# Patient Record
Sex: Female | Born: 1975 | Race: White | Hispanic: No | Marital: Married | Smoking: Never smoker
Health system: Southern US, Community
[De-identification: ages and names within clinical notes are randomized; demographics above are authoritative.]

## PROBLEM LIST (undated history)

## (undated) ENCOUNTER — Inpatient Hospital Stay (HOSPITAL_COMMUNITY): Payer: Self-pay

## (undated) DIAGNOSIS — B999 Unspecified infectious disease: Secondary | ICD-10-CM

## (undated) DIAGNOSIS — J40 Bronchitis, not specified as acute or chronic: Secondary | ICD-10-CM

## (undated) DIAGNOSIS — R87619 Unspecified abnormal cytological findings in specimens from cervix uteri: Secondary | ICD-10-CM

## (undated) DIAGNOSIS — Z9889 Other specified postprocedural states: Secondary | ICD-10-CM

## (undated) DIAGNOSIS — N1370 Vesicoureteral-reflux, unspecified: Secondary | ICD-10-CM

## (undated) DIAGNOSIS — IMO0002 Reserved for concepts with insufficient information to code with codable children: Secondary | ICD-10-CM

## (undated) DIAGNOSIS — D649 Anemia, unspecified: Secondary | ICD-10-CM

## (undated) DIAGNOSIS — Z5189 Encounter for other specified aftercare: Secondary | ICD-10-CM

## (undated) DIAGNOSIS — E041 Nontoxic single thyroid nodule: Secondary | ICD-10-CM

## (undated) DIAGNOSIS — R112 Nausea with vomiting, unspecified: Secondary | ICD-10-CM

## (undated) DIAGNOSIS — S42302A Unspecified fracture of shaft of humerus, left arm, initial encounter for closed fracture: Secondary | ICD-10-CM

## (undated) HISTORY — DX: Bronchitis, not specified as acute or chronic: J40

## (undated) HISTORY — DX: Unspecified abnormal cytological findings in specimens from cervix uteri: R87.619

## (undated) HISTORY — DX: Reserved for concepts with insufficient information to code with codable children: IMO0002

## (undated) HISTORY — DX: Anemia, unspecified: D64.9

## (undated) HISTORY — PX: APPENDECTOMY: SHX54

## (undated) HISTORY — DX: Nontoxic single thyroid nodule: E04.1

## (undated) HISTORY — DX: Unspecified infectious disease: B99.9

## (undated) HISTORY — DX: Vesicoureteral-reflux, unspecified: N13.70

## (undated) HISTORY — DX: Unspecified fracture of shaft of humerus, left arm, initial encounter for closed fracture: S42.302A

## (undated) HISTORY — PX: WISDOM TOOTH EXTRACTION: SHX21

## (undated) HISTORY — PX: BREAST ENHANCEMENT SURGERY: SHX7

## (undated) HISTORY — DX: Encounter for other specified aftercare: Z51.89

---

## 1998-06-21 ENCOUNTER — Emergency Department (HOSPITAL_COMMUNITY): Admission: EM | Admit: 1998-06-21 | Discharge: 1998-06-21 | Payer: Self-pay | Admitting: Emergency Medicine

## 1998-06-21 ENCOUNTER — Encounter: Payer: Self-pay | Admitting: Emergency Medicine

## 2005-06-20 ENCOUNTER — Other Ambulatory Visit: Admission: RE | Admit: 2005-06-20 | Discharge: 2005-06-20 | Payer: Self-pay | Admitting: *Deleted

## 2005-07-12 ENCOUNTER — Other Ambulatory Visit: Admission: RE | Admit: 2005-07-12 | Discharge: 2005-07-12 | Payer: Self-pay | Admitting: Endocrinology

## 2005-08-23 ENCOUNTER — Other Ambulatory Visit: Admission: RE | Admit: 2005-08-23 | Discharge: 2005-08-23 | Payer: Self-pay | Admitting: *Deleted

## 2005-09-04 DIAGNOSIS — E041 Nontoxic single thyroid nodule: Secondary | ICD-10-CM

## 2005-09-04 HISTORY — DX: Nontoxic single thyroid nodule: E04.1

## 2005-09-04 HISTORY — PX: THYROID SURGERY: SHX805

## 2006-03-01 ENCOUNTER — Other Ambulatory Visit: Admission: RE | Admit: 2006-03-01 | Discharge: 2006-03-01 | Payer: Self-pay | Admitting: *Deleted

## 2007-03-09 ENCOUNTER — Inpatient Hospital Stay (HOSPITAL_COMMUNITY): Admission: AD | Admit: 2007-03-09 | Discharge: 2007-03-12 | Payer: Self-pay | Admitting: Obstetrics and Gynecology

## 2010-04-03 ENCOUNTER — Inpatient Hospital Stay (HOSPITAL_COMMUNITY): Admission: AD | Admit: 2010-04-03 | Discharge: 2010-04-03 | Payer: Self-pay | Admitting: Obstetrics and Gynecology

## 2010-04-26 ENCOUNTER — Ambulatory Visit (HOSPITAL_COMMUNITY): Admission: RE | Admit: 2010-04-26 | Discharge: 2010-04-26 | Payer: Self-pay | Admitting: Obstetrics and Gynecology

## 2010-09-12 ENCOUNTER — Inpatient Hospital Stay (HOSPITAL_COMMUNITY)
Admission: AD | Admit: 2010-09-12 | Discharge: 2010-09-14 | Disposition: A | Discharge status: Still In Hospital | Payer: Self-pay | Source: Home / Self Care | Attending: Obstetrics and Gynecology | Admitting: Obstetrics and Gynecology

## 2010-09-16 ENCOUNTER — Inpatient Hospital Stay (HOSPITAL_COMMUNITY): Admission: AD | Admit: 2010-09-16 | Payer: Self-pay | Source: Home / Self Care | Admitting: Obstetrics and Gynecology

## 2010-09-17 ENCOUNTER — Observation Stay (HOSPITAL_COMMUNITY)
Admission: AD | Admit: 2010-09-17 | Discharge: 2010-09-17 | Payer: Self-pay | Source: Home / Self Care | Attending: Obstetrics and Gynecology | Admitting: Obstetrics and Gynecology

## 2010-09-19 LAB — CBC
HCT: 29.2 % — ABNORMAL LOW (ref 36.0–46.0)
HCT: 19.4 % — ABNORMAL LOW (ref 36.0–46.0)
HCT: 23.1 % — ABNORMAL LOW (ref 36.0–46.0)
HCT: 27.7 % — ABNORMAL LOW (ref 36.0–46.0)
Hemoglobin: 9.2 g/dL — ABNORMAL LOW (ref 12.0–15.0)
Hemoglobin: 6.1 g/dL — CL (ref 12.0–15.0)
Hemoglobin: 7.1 g/dL — ABNORMAL LOW (ref 12.0–15.0)
Hemoglobin: 8.9 g/dL — ABNORMAL LOW (ref 12.0–15.0)
MCH: 22.9 pg — ABNORMAL LOW (ref 26.0–34.0)
MCH: 23.5 pg — ABNORMAL LOW (ref 26.0–34.0)
MCH: 23.1 pg — ABNORMAL LOW (ref 26.0–34.0)
MCH: 24.7 pg — ABNORMAL LOW (ref 26.0–34.0)
MCHC: 31.5 g/dL (ref 30.0–36.0)
MCHC: 31.4 g/dL (ref 30.0–36.0)
MCHC: 30.7 g/dL (ref 30.0–36.0)
MCHC: 32.1 g/dL (ref 30.0–36.0)
MCV: 72.6 fL — ABNORMAL LOW (ref 78.0–100.0)
MCV: 74.6 fL — ABNORMAL LOW (ref 78.0–100.0)
MCV: 75.2 fL — ABNORMAL LOW (ref 78.0–100.0)
MCV: 76.7 fL — ABNORMAL LOW (ref 78.0–100.0)
Platelets: 264 10*3/uL (ref 150–400)
Platelets: 211 10*3/uL (ref 150–400)
Platelets: 270 10*3/uL (ref 150–400)
Platelets: 293 10*3/uL (ref 150–400)
RBC: 4.02 MIL/uL (ref 3.87–5.11)
RBC: 2.6 MIL/uL — ABNORMAL LOW (ref 3.87–5.11)
RBC: 3.07 MIL/uL — ABNORMAL LOW (ref 3.87–5.11)
RBC: 3.61 MIL/uL — ABNORMAL LOW (ref 3.87–5.11)
RDW: 14.6 % (ref 11.5–15.5)
RDW: 15 % (ref 11.5–15.5)
RDW: 15.6 % — ABNORMAL HIGH (ref 11.5–15.5)
RDW: 16.3 % — ABNORMAL HIGH (ref 11.5–15.5)
WBC: 14.2 10*3/uL — ABNORMAL HIGH (ref 4.0–10.5)
WBC: 15.5 10*3/uL — ABNORMAL HIGH (ref 4.0–10.5)
WBC: 12.9 10*3/uL — ABNORMAL HIGH (ref 4.0–10.5)
WBC: 13.3 10*3/uL — ABNORMAL HIGH (ref 4.0–10.5)

## 2010-09-19 LAB — RPR: RPR Ser Ql: NONREACTIVE

## 2010-09-19 LAB — ABO/RH: ABO/RH(D): AB POS

## 2010-09-21 LAB — CROSSMATCH
ABO/RH(D): AB POS
Antibody Screen: NEGATIVE
Unit division: 0
Unit division: 0

## 2010-11-19 LAB — URINALYSIS, ROUTINE W REFLEX MICROSCOPIC
Bilirubin Urine: NEGATIVE
Glucose, UA: NEGATIVE mg/dL
Hgb urine dipstick: NEGATIVE
Ketones, ur: NEGATIVE mg/dL
Nitrite: NEGATIVE
Protein, ur: NEGATIVE mg/dL
Specific Gravity, Urine: <1.005 — ABNORMAL LOW (ref 1.005–1.030)
Urobilinogen, UA: 0.2 mg/dL (ref 0.0–1.0)
pH: 6 (ref 5.0–8.0)

## 2010-11-19 LAB — WET PREP, GENITAL
Clue Cells Wet Prep HPF POC: NONE SEEN
Trich, Wet Prep: NONE SEEN
Yeast Wet Prep HPF POC: NONE SEEN

## 2011-01-17 NOTE — H&P (Signed)
NAMELOWEN, Diane NO.:  [id number]   MEDICAL RECORD NO.:  [id number]          PATIENT TYPE:  INP   LOCATION:  9165                          FACILITY:  [facility]   PHYSICIAN:  Osborn Coho, M.D.   DATE OF BIRTH:  06/15/1976   DATE OF ADMISSION:  03/09/2007  DATE OF DISCHARGE:                              HISTORY & PHYSICAL   Diane [name] is a 35 year old, married white female, primigravida at 21-  3/7 weeks who presents with leaking clear fluid this morning. She has  been having irregular contractions, no bleeding, positive fetal  movement, no signs or symptoms of PIH. Her pregnancy has been followed  by the [facility] Cert[facility] Midwife [facility] and has  been remarkable for:  1) Thyroid disease with partial thyroidectomy, 2)  group B strep negative.   Prenatal labs were collected on August 13, 2006. Hemoglobin 13.0,  hematocrit 37.3, platelets 236,000, blood type AB positive, antibody  negative, RPR nonreactive, rubella immune, hepatitis B surface antigen  negative, TSH was 1.409. Pap smear from March 01, 2006 was within normal  limits and Pap smear from August 13, 2006 was within normal limits.  One-hour Glucola from December 06, 2006 was 74, TSH at that time was 2.157.  Culture of the vaginal tract for group B strep in June 2008 was  negative.   HISTORY OF PRESENT PREGNANCY:  The patient presented for care at [facility] on August 13, 2006 at 10-6/[redacted] weeks gestation. Pregnancy  ultrasonography October 09, 2006 shows growth consistent with previous  dating confirming [facility] of March 06, 2007. TSH levels drawn each trimester  were within normal limits. The patient has remained normotensive with  size equal to dates throughout her pregnancy. She brought in a birth  plan in third trimester that was reviewed and she planned on a doula  during labor.   OBSTETRIC HISTORY:  She is primigravida.   MEDICAL HISTORY:  She has no medication allergies. She  experienced  menarche at the age 46 with 30-35 day cycles lasting 7 days. She had an  abnormal Pap smear in November 2006 with normal since then. She has used  diaphragm and IUD in the past for contraception. She had a history of  nodules on her thyroid with half of her thyroid removed not requiring  medications now. She had a urinary tract infection as a child.   SURGICAL HISTORY:  Remarkable for partial thyroidectomy. Also remarkable  for wisdom teeth extraction and appendectomy.   FAMILY HISTORY:  Maternal grandfather with MI, mother with varicosities.  Mother with pulmonary fibrosis. Father with type 2 diabetes. Paternal  grandfather with lung cancer. Paternal grandmother with breast cancer.   GENETIC HISTORY:  Negative.   SOCIAL HISTORY:  The patient is married to the father of the baby, his  name is Alycia Rossetti, he is involved and supportive. They are of the [place]  faith. They both have 16 years of education. The patient is employed  fulltime as a Quarry manager. The father of the baby is a Pharmacist, hospital. They deny any alcohol, tobacco or illicit  drug use with the  pregnancy.   OBJECTIVE:  VITAL SIGNS:  Blood pressure is 120/80, other vital signs  are stable. She is afebrile.  HEENT:  Grossly within normal limits.  CHEST:  Clear to auscultation.  HEART:  Regular rate and rhythm.  ABDOMEN:  Gravid in contour with fundal height extending approximately  40 cm above the pubic symphysic. Fetal heart rate is reactive and  reassuring. Contractions are every 3-6 minutes and mild. A sterile  speculum exam shows positive pulling of clear fluid, positive Nitrazine,  positive ferning. The cervix is 2+ cm dilated, 70% effaced, vertex -2.  EXTREMITIES:  Normal.   ASSESSMENT:  1. Intrauterine pregnancy at term.  2. Spontaneous rupture of membranes.   PLAN:  1. Admit to birthing suite.  2. Routine CNM orders.  3. The patient plans expectant management for now. Birth plan is  on      our chart and has been reviewed.      Cam Hai, C.N.M.      Osborn Coho, M.D.  Electronically Signed    KS/MEDQ  D:  03/09/2007  T:  03/09/2007  Job:  [id number]

## 2011-06-20 LAB — RPR: RPR Ser Ql: NONREACTIVE

## 2011-06-20 LAB — CBC
HCT: 22.4 — ABNORMAL LOW
HCT: 31.8 — ABNORMAL LOW
Hemoglobin: 10.4 — ABNORMAL LOW
Hemoglobin: 7.6 — CL
MCHC: 32.7
MCHC: 33.8
MCV: 80.9
MCV: 81.4
Platelets: 222
Platelets: 284
RBC: 2.75 — ABNORMAL LOW
RBC: 3.93
RDW: 13.9
RDW: 14
WBC: 14.7 — ABNORMAL HIGH
WBC: 16.1 — ABNORMAL HIGH

## 2011-06-20 LAB — RAPID HIV SCREEN (WH-MAU): Rapid HIV Screen: NONREACTIVE

## 2012-06-23 ENCOUNTER — Telehealth (HOSPITAL_COMMUNITY): Payer: Self-pay | Admitting: Obstetrics and Gynecology

## 2012-06-23 NOTE — Telephone Encounter (Signed)
Pt approx [redacted]wks pregnant.  States she ran a 5K race this AM and now has noted a small amt of red spotting with sm amt in underpants and with wiping.  No cramping or pain.  Did have intercourse last PM as well.  Has appt in office on Tuesday, 06/26/12.  States blood type is AB pos.  Does have hx of SAB x 1 between her other 2 pregs.  Rec rest, fluids, pelvic rest, and observe.  SAB precautions d/w pt.  F/U at office as previously scheduled.

## 2012-06-25 ENCOUNTER — Ambulatory Visit (INDEPENDENT_AMBULATORY_CARE_PROVIDER_SITE_OTHER): Payer: BC Managed Care – PPO | Admitting: Obstetrics and Gynecology

## 2012-06-25 DIAGNOSIS — O26849 Uterine size-date discrepancy, unspecified trimester: Secondary | ICD-10-CM

## 2012-06-25 DIAGNOSIS — Z331 Pregnant state, incidental: Secondary | ICD-10-CM

## 2012-06-25 LAB — POCT URINALYSIS DIPSTICK
Bilirubin, UA: NEGATIVE
Blood, UA: NEGATIVE
Glucose, UA: NEGATIVE
Ketones, UA: NEGATIVE
Leukocytes, UA: NEGATIVE
Nitrite, UA: NEGATIVE
Protein, UA: NEGATIVE
Spec Grav, UA: 1.01
Urobilinogen, UA: NEGATIVE
pH, UA: 8

## 2012-06-25 LAB — TSH: TSH: 1.528 u[IU]/mL (ref 0.350–4.500)

## 2012-06-25 NOTE — Progress Notes (Signed)
NOB interview.  Pt unsure of LMP. Is breast feeding. Also had spotting 06/22/12 after running a 5 K and IC the previous evening Spoke with Conni Elliot and was told to observe. Per VL, QHCG done and U/S for dating scheduled. NOB W/U to be scheduled depending on results.

## 2012-06-26 ENCOUNTER — Other Ambulatory Visit: Payer: Self-pay | Admitting: Obstetrics and Gynecology

## 2012-06-26 ENCOUNTER — Telehealth: Payer: Self-pay | Admitting: Obstetrics and Gynecology

## 2012-06-26 DIAGNOSIS — O200 Threatened abortion: Secondary | ICD-10-CM

## 2012-06-26 LAB — PRENATAL PANEL VII
Antibody Screen: NEGATIVE
Basophils Absolute: 0 10*3/uL (ref 0.0–0.1)
Basophils Relative: 0 % (ref 0–1)
Eosinophils Absolute: 0.1 10*3/uL (ref 0.0–0.7)
Eosinophils Relative: 1 % (ref 0–5)
HCT: 40.7 % (ref 36.0–46.0)
HIV: NONREACTIVE
Hemoglobin: 14.6 g/dL (ref 12.0–15.0)
Hepatitis B Surface Ag: NEGATIVE
Lymphocytes Relative: 23 % (ref 12–46)
Lymphs Abs: 2.2 10*3/uL (ref 0.7–4.0)
MCH: 31.6 pg (ref 26.0–34.0)
MCHC: 35.9 g/dL (ref 30.0–36.0)
MCV: 88.1 fL (ref 78.0–100.0)
Monocytes Absolute: 0.8 10*3/uL (ref 0.1–1.0)
Monocytes Relative: 8 % (ref 3–12)
Neutro Abs: 6.4 10*3/uL (ref 1.7–7.7)
Neutrophils Relative %: 68 % (ref 43–77)
Platelets: 274 10*3/uL (ref 150–400)
RBC: 4.62 MIL/uL (ref 3.87–5.11)
RDW: 13.4 % (ref 11.5–15.5)
Rh Type: POSITIVE
Rubella: 315.9 IU/mL — ABNORMAL HIGH
WBC: 9.4 10*3/uL (ref 4.0–10.5)

## 2012-06-26 LAB — HCG, QUANTITATIVE, PREGNANCY: hCG, Beta Chain, Quant, S: 4374 m[IU]/mL

## 2012-06-26 NOTE — Telephone Encounter (Signed)
Liya called for Quant result: 4350 F/U USS Monday 07/01/12 as per VL

## 2012-06-27 ENCOUNTER — Encounter (HOSPITAL_COMMUNITY): Payer: Self-pay | Admitting: *Deleted

## 2012-06-27 ENCOUNTER — Inpatient Hospital Stay (HOSPITAL_COMMUNITY)
Admission: AD | Admit: 2012-06-27 | Discharge: 2012-06-27 | Disposition: A | Discharge status: Home / Self Care | Payer: BC Managed Care – PPO | Source: Ambulatory Visit | Attending: Obstetrics and Gynecology | Admitting: Obstetrics and Gynecology

## 2012-06-27 ENCOUNTER — Other Ambulatory Visit: Payer: Self-pay | Admitting: Obstetrics and Gynecology

## 2012-06-27 ENCOUNTER — Telehealth: Payer: Self-pay | Admitting: Obstetrics and Gynecology

## 2012-06-27 ENCOUNTER — Inpatient Hospital Stay (HOSPITAL_COMMUNITY): Payer: BC Managed Care – PPO

## 2012-06-27 ENCOUNTER — Encounter: Payer: Self-pay | Admitting: Obstetrics and Gynecology

## 2012-06-27 DIAGNOSIS — O30019 Twin pregnancy, monochorionic/monoamniotic, unspecified trimester: Secondary | ICD-10-CM

## 2012-06-27 DIAGNOSIS — O30009 Twin pregnancy, unspecified number of placenta and unspecified number of amniotic sacs, unspecified trimester: Secondary | ICD-10-CM | POA: Insufficient documentation

## 2012-06-27 DIAGNOSIS — O209 Hemorrhage in early pregnancy, unspecified: Secondary | ICD-10-CM | POA: Insufficient documentation

## 2012-06-27 DIAGNOSIS — Z3689 Encounter for other specified antenatal screening: Secondary | ICD-10-CM | POA: Insufficient documentation

## 2012-06-27 DIAGNOSIS — O200 Threatened abortion: Secondary | ICD-10-CM

## 2012-06-27 LAB — HCG, QUANTITATIVE, PREGNANCY: hCG, Beta Chain, Quant, S: 3997 m[IU]/mL

## 2012-06-27 LAB — CULTURE, OB URINE
Colony Count: NO GROWTH
Organism ID, Bacteria: NO GROWTH

## 2012-06-27 NOTE — Telephone Encounter (Signed)
S: Patient had called last night with Hx of Quant being draw earlier in the week on Monday 06/24/12. O: Had repeat Quant  completed today and the quant has declined from initial value 4374 > 3997 A: Possible SAB P: Called patient and offered for her to USS to confirm further findings. Patient will return this PM for USS as suggested.  Earl Gala, CNM.

## 2012-06-27 NOTE — MAU Note (Signed)
Pt had blood work drawn for [employer] and is going to have an ultrasound today

## 2012-06-27 NOTE — MAU Provider Note (Signed)
History   Diane [name] has been concerned re Quant levels from this week. Had repeat Quant level this morning. Had called patient this morning with the result of the quant. The patient had been advised of  Quant Level decreasing. Offered to return for USS this pm to confirm findings. Patient to have USS for  ongpoing pregnancy confirmation today. Patient is very nervous and upset at this time has had Hx of AB.  CSN: [id number]  Arrival date and time: 06/27/12 1326   None     Chief Complaint  Patient presents with  . Possible Pregnancy   HPI  OB History    Grav Para Term Preterm Abortions TAB SAB Ect Mult Living   4 2 2  1  1   2      Obstetric Comments   2012 3 DAY PP HEMORRHAGE; LOW HG - BLOOD TRANSFUSION      Past Medical History  Diagnosis Date  . Thyroid cyst 2007     NO MEDS  . Blood transfusion without reported diagnosis     PP 2012  . Abnormal Pap smear     REPEAT NORMAL; LAST PAP 12/2010  . Infection     UTI OCC  . Urinary reflux CHILDHOOD  . Anemia     DURING PREG  . Bronchitis     WINTER  . Arm fracture, left AGE 73    Past Surgical History  Procedure Date  . Appendectomy AGE 58  . Thyroid surgery 2007  . Wisdom tooth extraction AGE 53    Family History  Problem Relation Age of Onset  . Rheum arthritis Mother   . Other Mother     PULMONARY FIBROSIS; VARICOSE VEINS  . Cancer Mother     MELANOMA  . Hypertension Mother   . Arthritis Father   . Hearing loss Father   . Hyperlipidemia Father   . Hypertension Father   . Other Sister     VARCOSE VEINS  . Kidney disease Sister     URINARY REFLUX  . Kidney disease Daughter     URINARY REFLUX  . Heart disease Maternal Uncle   . Miscarriages / Stillbirths Maternal Grandmother   . Heart disease Maternal Grandfather   . Cancer Paternal Grandmother     BREAST  . Hearing loss Paternal Grandmother   . Cancer Paternal Grandfather     LUNG  . Thyroid disease Sister   . Kidney disease Sister    URINARY REFLUX  . Kidney disease Other     URINARY REFLUX    History  Substance Use Topics  . Smoking status: Never Smoker   . Smokeless tobacco: Never Used  . Alcohol Use: 3.5 oz/week    7 drink(s) per week     WINE; D/C'D WITH +UPT    Allergies: No Known Allergies  Prescriptions prior to admission  Medication Sig Dispense Refill  . Prenatal Vit-Fe Fumarate-FA (PRENATAL PO) Take 1 tablet by mouth. OTC        ROS Physical Exam   Last menstrual period 05/10/2012, unknown if currently breastfeeding.  Physical Exam  MAU Course  Procedures F/U on declining Quant with USS.  Assessment and Plan  Result of USS: Living Monochorionic Twin IUP with [place] Gest. Age of [redacted]w[redacted]d and EDD of 02/21/13. Amnionicity not confidently determined and Mono-amniotic twins cannot be excluded. Small subchorionic hemorrhage noted. Small SCH seen and advised not to exercise for 2 - 3 weeks  F/U USS in 4 weeks recommended to f/u  amnionicity. F/U Quant on Monday 07/01/12 at [facility].  Trinitee Horgan, CNM. 06/27/2012, 1:29 PM

## 2012-06-28 ENCOUNTER — Other Ambulatory Visit: Payer: Self-pay | Admitting: Obstetrics and Gynecology

## 2012-06-28 ENCOUNTER — Telehealth: Payer: Self-pay

## 2012-06-28 ENCOUNTER — Other Ambulatory Visit: Payer: Self-pay

## 2012-06-28 DIAGNOSIS — IMO0001 Reserved for inherently not codable concepts without codable children: Secondary | ICD-10-CM

## 2012-06-28 DIAGNOSIS — O30009 Twin pregnancy, unspecified number of placenta and unspecified number of amniotic sacs, unspecified trimester: Secondary | ICD-10-CM

## 2012-06-28 NOTE — Telephone Encounter (Signed)
Message copied by Rolla Plate on Fri Jun 28, 2012  9:59 AM ------      Message from: [name]      Created: Fri Jun 28, 2012  1:17 AM       Pt had a [place] that suggested she has mono/ mono twins.  Refer pt to MFM for Korea, counseling if needed and dx.

## 2012-06-28 NOTE — Telephone Encounter (Signed)
Spoke with pt rgd referral informed appt with [facility] 07/02/12 at 12:30 pt voice understanding

## 2012-06-29 ENCOUNTER — Telehealth: Payer: Self-pay | Admitting: Obstetrics and Gynecology

## 2012-06-29 NOTE — Telephone Encounter (Signed)
Patient called. Pregnancy discussed. The patient had a viable twin gestation on ultrasound. Her hCG values, however, were decreasing slightly. She denies bleeding and cramping. She has an appointment with maternal fetal medicine in 3 days. For the moment, she has no insurance coverage and she pays all of her medical bills out of pocket.  Management options reviewed. Repeat hCG, repeat ultrasound, MFM consultation, and observation outlined. The risk and benefits were discussed. For now, the patient will cancel the MFM consultation. She will discuss our review with her husband. She will call us in 1-2 weeks to let us know how she is doing and how she feels she wants to proceed.  Genetic testing discussed. The patient was told to call if she has any bleeding or cramping.  Dr. Stefano Gaul

## 2012-07-01 ENCOUNTER — Encounter: Payer: BC Managed Care – PPO | Admitting: Obstetrics and Gynecology

## 2012-07-01 ENCOUNTER — Other Ambulatory Visit: Payer: BC Managed Care – PPO

## 2012-07-02 ENCOUNTER — Ambulatory Visit (HOSPITAL_COMMUNITY): Payer: BC Managed Care – PPO

## 2012-07-08 ENCOUNTER — Telehealth: Payer: Self-pay | Admitting: Obstetrics and Gynecology

## 2012-07-08 NOTE — Telephone Encounter (Signed)
Pt returning your call

## 2012-07-11 ENCOUNTER — Telehealth: Payer: Self-pay | Admitting: Obstetrics and Gynecology

## 2012-07-12 ENCOUNTER — Telehealth: Payer: Self-pay

## 2012-07-12 ENCOUNTER — Other Ambulatory Visit: Payer: Self-pay

## 2012-07-12 DIAGNOSIS — IMO0001 Reserved for inherently not codable concepts without codable children: Secondary | ICD-10-CM

## 2012-07-12 NOTE — Telephone Encounter (Signed)
Pt was scheduled for U/S on 07-15-2012 @ 2:30pm Pt made aware and pt agreeable.  [facility] CMA

## 2012-07-15 ENCOUNTER — Encounter: Payer: Self-pay | Admitting: Obstetrics and Gynecology

## 2012-07-15 ENCOUNTER — Ambulatory Visit (INDEPENDENT_AMBULATORY_CARE_PROVIDER_SITE_OTHER): Payer: BC Managed Care – PPO | Admitting: Obstetrics and Gynecology

## 2012-07-15 ENCOUNTER — Ambulatory Visit (INDEPENDENT_AMBULATORY_CARE_PROVIDER_SITE_OTHER): Payer: BC Managed Care – PPO

## 2012-07-15 ENCOUNTER — Encounter: Payer: BC Managed Care – PPO | Admitting: Obstetrics and Gynecology

## 2012-07-15 VITALS — BP 114/66 | Ht 64.0 in | Wt 135.0 lb

## 2012-07-15 DIAGNOSIS — O30009 Twin pregnancy, unspecified number of placenta and unspecified number of amniotic sacs, unspecified trimester: Secondary | ICD-10-CM

## 2012-07-15 DIAGNOSIS — IMO0001 Reserved for inherently not codable concepts without codable children: Secondary | ICD-10-CM

## 2012-07-15 DIAGNOSIS — O021 Missed abortion: Secondary | ICD-10-CM

## 2012-07-15 NOTE — Progress Notes (Signed)
HISTORY OF PRESENT ILLNESS  [name] is a 36 y.o. year old female,G4P2012, who presents for a problem visit. The patient has a known twin gestation.  A previous ultrasound show fetal heart rates.  Her hCG values were decreasing however.  The patient has had a miscarriage in the past.  She was treated with Cytotec but had a very unpleasant experience.  It took 5 months for her hCG values to return to normal. Blood type is AB+.  Subjective:  The patient is very sad about her nonviable twins on ultrasound.  Objective:  BP 114/66  Ht 5' 4" (1.626 m)  Wt 135 lb (61.236 kg)  BMI 23.17 kg/m2  LMP 05/10/2012  Breastfeeding? No   General: mild distress  Exam deferred.  Ultrasound: Twin gestation with both twins being nonviable.  Gestational age for twin A is 7 weeks and 6 days.  Gestational age for twin B is 6 weeks and 2 days.  The ovaries appear normal.  Assessment:  Missed abortion of twin gestation in the first trimester.  Prior miscarriage.  Plan:  Management options reviewed.  Risk and benefits discussed.  The patient would discuss future care with her husband.  I will call later tonight.  Return to office prn if symptoms worsen or fail to improve.  Telephone call to patient: the patient wants a dilatation and evacuation on Tuesday, July 16, 2012.  She is scheduled at 4:30 PM.  She will remain n.p.o. after 8:00 AM.  Dr. Roberts will be her surgeon.   Reanne Nellums Vernon Handsome Anglin M.D.  07/15/2012 6:56 PM   

## 2012-07-16 ENCOUNTER — Encounter (HOSPITAL_COMMUNITY): Payer: Self-pay | Admitting: Anesthesiology

## 2012-07-16 ENCOUNTER — Ambulatory Visit (HOSPITAL_COMMUNITY)
Admission: RE | Admit: 2012-07-16 | Discharge: 2012-07-16 | Disposition: A | Discharge status: Home / Self Care | Payer: BC Managed Care – PPO | Source: Ambulatory Visit | Attending: Obstetrics and Gynecology | Admitting: Obstetrics and Gynecology

## 2012-07-16 ENCOUNTER — Encounter: Payer: BC Managed Care – PPO | Admitting: Obstetrics and Gynecology

## 2012-07-16 ENCOUNTER — Ambulatory Visit (HOSPITAL_COMMUNITY): Payer: BC Managed Care – PPO | Admitting: Anesthesiology

## 2012-07-16 ENCOUNTER — Encounter (HOSPITAL_COMMUNITY): Payer: Self-pay | Admitting: *Deleted

## 2012-07-16 ENCOUNTER — Encounter (HOSPITAL_COMMUNITY)
Admission: RE | Disposition: A | Discharge status: Home / Self Care | Payer: Self-pay | Source: Ambulatory Visit | Attending: Obstetrics and Gynecology

## 2012-07-16 DIAGNOSIS — O021 Missed abortion: Secondary | ICD-10-CM | POA: Insufficient documentation

## 2012-07-16 DIAGNOSIS — O034 Incomplete spontaneous abortion without complication: Secondary | ICD-10-CM

## 2012-07-16 HISTORY — PX: DILATION AND EVACUATION: SHX1459

## 2012-07-16 HISTORY — DX: Other specified postprocedural states: Z98.890

## 2012-07-16 HISTORY — DX: Nausea with vomiting, unspecified: R11.2

## 2012-07-16 LAB — CBC
MCH: 31.2 pg (ref 26.0–34.0)
MCV: 88.1 fL (ref 78.0–100.0)
Platelets: 281 10*3/uL (ref 150–400)
RDW: 12.9 % (ref 11.5–15.5)

## 2012-07-16 SURGERY — DILATION AND EVACUATION, UTERUS
Anesthesia: Monitor Anesthesia Care | Wound class: Clean Contaminated

## 2012-07-16 MED ORDER — ONDANSETRON HCL 4 MG/2ML IJ SOLN: 4.0000 mg | Freq: Once | INTRAMUSCULAR | Status: DC | PRN

## 2012-07-16 MED ORDER — LACTATED RINGERS IV SOLN
INTRAVENOUS | Status: DC
  Administered 2012-07-16: 16:00:00 via INTRAVENOUS

## 2012-07-16 MED ORDER — ONDANSETRON HCL 4 MG/2ML IJ SOLN
INTRAMUSCULAR | Status: DC | PRN
  Administered 2012-07-16: 4 mg via INTRAVENOUS

## 2012-07-16 MED ORDER — PROPOFOL 10 MG/ML IV EMUL
INTRAVENOUS | Status: AC
  Filled 2012-07-16: qty 20

## 2012-07-16 MED ORDER — HYDROCODONE-ACETAMINOPHEN 5-500 MG PO TABS: 1.0000 | ORAL_TABLET | Freq: Four times a day (QID) | ORAL | Status: DC | PRN

## 2012-07-16 MED ORDER — MEPERIDINE HCL 25 MG/ML IJ SOLN: 6.2500 mg | INTRAMUSCULAR | Status: DC | PRN

## 2012-07-16 MED ORDER — KETOROLAC TROMETHAMINE 30 MG/ML IJ SOLN
INTRAMUSCULAR | Status: AC
  Filled 2012-07-16: qty 1

## 2012-07-16 MED ORDER — LIDOCAINE HCL 2 % IJ SOLN
INTRAMUSCULAR | Status: DC | PRN
  Administered 2012-07-16: 10 mL

## 2012-07-16 MED ORDER — MIDAZOLAM HCL 2 MG/2ML IJ SOLN
INTRAMUSCULAR | Status: AC
  Filled 2012-07-16: qty 2

## 2012-07-16 MED ORDER — ONDANSETRON HCL 4 MG/2ML IJ SOLN
INTRAMUSCULAR | Status: AC
  Filled 2012-07-16: qty 2

## 2012-07-16 MED ORDER — SCOPOLAMINE 1 MG/3DAYS TD PT72
MEDICATED_PATCH | TRANSDERMAL | Status: AC
  Administered 2012-07-16: 1.5 mg via TRANSDERMAL
  Filled 2012-07-16: qty 1

## 2012-07-16 MED ORDER — LIDOCAINE HCL 2 % IJ SOLN
INTRAMUSCULAR | Status: AC
  Filled 2012-07-16: qty 20

## 2012-07-16 MED ORDER — FENTANYL CITRATE 0.05 MG/ML IJ SOLN: 25.0000 ug | INTRAMUSCULAR | Status: DC | PRN

## 2012-07-16 MED ORDER — KETOROLAC TROMETHAMINE 30 MG/ML IJ SOLN: 15.0000 mg | Freq: Once | INTRAMUSCULAR | Status: DC | PRN

## 2012-07-16 MED ORDER — KETOROLAC TROMETHAMINE 30 MG/ML IJ SOLN
INTRAMUSCULAR | Status: DC | PRN
  Administered 2012-07-16: 30 mg via INTRAVENOUS

## 2012-07-16 MED ORDER — IBUPROFEN 600 MG PO TABS: 600.0000 mg | ORAL_TABLET | Freq: Four times a day (QID) | ORAL | Status: DC | PRN

## 2012-07-16 MED ORDER — FENTANYL CITRATE 0.05 MG/ML IJ SOLN
INTRAMUSCULAR | Status: AC
  Filled 2012-07-16: qty 2

## 2012-07-16 MED ORDER — PROPOFOL 10 MG/ML IV EMUL
INTRAVENOUS | Status: DC | PRN
  Administered 2012-07-16 (×2): 30 mg via INTRAVENOUS
  Administered 2012-07-16: 50 mg via INTRAVENOUS
  Administered 2012-07-16: 30 mg via INTRAVENOUS

## 2012-07-16 MED ORDER — LIDOCAINE HCL (CARDIAC) 20 MG/ML IV SOLN
INTRAVENOUS | Status: AC
  Filled 2012-07-16: qty 5

## 2012-07-16 MED ORDER — LIDOCAINE HCL (CARDIAC) 20 MG/ML IV SOLN
INTRAVENOUS | Status: DC | PRN
  Administered 2012-07-16: 50 mg via INTRAVENOUS

## 2012-07-16 MED ORDER — SCOPOLAMINE 1 MG/3DAYS TD PT72
1.0000 | MEDICATED_PATCH | Freq: Once | TRANSDERMAL | Status: DC
  Administered 2012-07-16: 1.5 mg via TRANSDERMAL

## 2012-07-16 SURGICAL SUPPLY — 21 items
CATH ROBINSON RED A/P 16FR (CATHETERS) ×2 IMPLANT
CLOTH BEACON ORANGE TIMEOUT ST (SAFETY) ×2 IMPLANT
DECANTER SPIKE VIAL GLASS SM (MISCELLANEOUS) ×2 IMPLANT
GLOVE BIO SURGEON STRL SZ7.5 (GLOVE) ×4 IMPLANT
GLOVE BIOGEL PI IND STRL 7.5 (GLOVE) ×1 IMPLANT
GLOVE BIOGEL PI INDICATOR 7.5 (GLOVE) ×1
GOWN STRL REIN XL XLG (GOWN DISPOSABLE) ×2 IMPLANT
KIT BERKELEY 1ST TRIMESTER 3/8 (MISCELLANEOUS) ×2 IMPLANT
NEEDLE SPNL 22GX3.5 QUINCKE BK (NEEDLE) ×2 IMPLANT
NS IRRIG 1000ML POUR BTL (IV SOLUTION) ×2 IMPLANT
PACK VAGINAL MINOR WOMEN LF (CUSTOM PROCEDURE TRAY) ×2 IMPLANT
PAD OB MATERNITY 4.3X12.25 (PERSONAL CARE ITEMS) ×2 IMPLANT
PAD PREP 24X48 CUFFED NSTRL (MISCELLANEOUS) ×2 IMPLANT
SET BERKELEY SUCTION TUBING (SUCTIONS) ×2 IMPLANT
SYR CONTROL 10ML LL (SYRINGE) ×2 IMPLANT
TOWEL OR 17X24 6PK STRL BLUE (TOWEL DISPOSABLE) ×4 IMPLANT
VACURETTE 10 RIGID CVD (CANNULA) IMPLANT
VACURETTE 7MM CVD STRL WRAP (CANNULA) IMPLANT
VACURETTE 8 RIGID CVD (CANNULA) IMPLANT
VACURETTE 9 RIGID CVD (CANNULA) IMPLANT
WATER STERILE IRR 1000ML POUR (IV SOLUTION) ×2 IMPLANT

## 2012-07-16 NOTE — Op Note (Signed)
Preop Diagnosis: Missed Abortion,  7-Weeks Twins   Postop Diagnosis: Missed Abortion,  7-Weeks Twins   Procedure: DILATATION AND EVACUATION   Anesthesia: Moderate Sedation   Anesthesiologist: Sandrea Hughs., MD   Attending: Purcell Nails, MD   Assistant: N/a  Findings: POCs and Uterus sounded to 9cm  Pathology: POCs  Fluids: 400cc  UOP: QS via straight cath prior to procedure  EBL: Minimal  Complications: None  Procedure: The patient was taken to the operating room after the risks benefits and alternatives were discussed with the patient, the patient verbalized understanding and consent signed and witnessed.  The patient was placed under MAC anesthesia, prepped and draped in the normal sterile fashion and a time out was performed.  A bivalve speculum was placed in the patient's vagina and the anterior lip of the cervix grasped with a single-tooth tenaculum. A paracervical block was administered using a total of 10 cc of 2% lidocaine.  The uterus was sounded to 9 cm and a size 8 suction curette was used. Suction curettage was performed until minimal tissue returned. Sharp curettage was performed until a gritty texture was noted. Suction curettage was performed once again to remove any remaining debris. All instruments were removed. The count was correct. The patient was transferred to the recovery room in good condition.

## 2012-07-16 NOTE — H&P (View-Only) (Signed)
HISTORY OF PRESENT ILLNESS  [name] is a 36 y.o. year old female,G4P2012, who presents for a problem visit. The patient has a known twin gestation.  A previous ultrasound show fetal heart rates.  Her hCG values were decreasing however.  The patient has had a miscarriage in the past.  She was treated with Cytotec but had a very unpleasant experience.  It took 5 months for her hCG values to return to normal. Blood type is AB+.  Subjective:  The patient is very sad about her nonviable twins on ultrasound.  Objective:  BP 114/66  Ht 5\' 4"  (1.626 m)  Wt 135 lb (61.236 kg)  BMI 23.17 kg/m2  LMP 05/10/2012  Breastfeeding? No   General: mild distress  Exam deferred.  Ultrasound: Twin gestation with both twins being nonviable.  Gestational age for twin A is 7 weeks and 6 days.  Gestational age for twin B is 6 weeks and 2 days.  The ovaries appear normal.  Assessment:  Missed abortion of twin gestation in the first trimester.  Prior miscarriage.  Plan:  Management options reviewed.  Risk and benefits discussed.  The patient would discuss future care with her husband.  I will call later tonight.  Return to office prn if symptoms worsen or fail to improve.  Telephone call to patient: the patient wants a dilatation and evacuation on Tuesday, July 16, 2012.  She is scheduled at 4:30 PM.  She will remain n.p.o. after 8:00 AM.  Dr. Su Hilt will be her surgeon.   Leonard Schwartz M.D.  07/15/2012 6:56 PM

## 2012-07-16 NOTE — Progress Notes (Signed)
Post discharge checklist done at 1830, not 1953.  Late entry.

## 2012-07-16 NOTE — Interval H&P Note (Signed)
History and Physical Interval Note:  07/16/2012 3:37 PM  Diane [name]  has presented today for surgery, with the diagnosis of Missed Abortion,  7 weeks twins  The various methods of treatment have been discussed with the patient and family. After consideration of risks, benefits and other options for treatment, the patient has consented to  Procedure(s) (LRB) with comments: DILATATION AND EVACUATION (N/A) as a surgical intervention .  The patient's history has been reviewed, patient examined, no change in status, stable for surgery.  I have reviewed the patient's chart and labs.  Questions were answered to the patient's satisfaction.     Purcell Nails

## 2012-07-16 NOTE — Transfer of Care (Signed)
Immediate Anesthesia Transfer of Care Note  Patient: Diane [name]  Procedure(s) Performed: Procedure(s) (LRB) with comments: DILATATION AND EVACUATION (N/A)  Patient Location: PACU  Anesthesia Type:MAC  Level of Consciousness: awake, alert , oriented and patient cooperative  Airway & Oxygen Therapy: Patient Spontanous Breathing  Post-op Assessment: Report given to PACU RN, Post -op Vital signs reviewed and stable and Patient moving all extremities X 4  Post vital signs: Reviewed and stable  Complications: No apparent anesthesia complications

## 2012-07-16 NOTE — Anesthesia Preprocedure Evaluation (Signed)
Anesthesia Evaluation  Patient identified by MRN, date of birth, ID band Patient awake    Reviewed: Allergy & Precautions, H&P , NPO status , Patient's Chart, lab work & pertinent test results  History of Anesthesia Complications (+) PONV  Airway Mallampati: I TM Distance: >3 FB Neck ROM: full    Dental No notable dental hx. (+) Teeth Intact   Pulmonary neg pulmonary ROS,    Pulmonary exam normal       Cardiovascular negative cardio ROS      Neuro/Psych negative neurological ROS  negative psych ROS   GI/Hepatic negative GI ROS, Neg liver ROS,   Endo/Other  negative endocrine ROS  Renal/GU negative Renal ROS  negative genitourinary   Musculoskeletal negative musculoskeletal ROS (+)   Abdominal Normal abdominal exam  (+)   Peds negative pediatric ROS (+)  Hematology negative hematology ROS (+)   Anesthesia Other Findings   Reproductive/Obstetrics negative OB ROS                           Anesthesia Physical Anesthesia Plan  ASA: II  Anesthesia Plan: MAC   Post-op Pain Management:    Induction: Intravenous  Airway Management Planned:   Additional Equipment:   Intra-op Plan:   Post-operative Plan:   Informed Consent: I have reviewed the patients History and Physical, chart, labs and discussed the procedure including the risks, benefits and alternatives for the proposed anesthesia with the patient or authorized representative who has indicated his/her understanding and acceptance.     Plan Discussed with: CRNA and Surgeon  Anesthesia Plan Comments:         Anesthesia Quick Evaluation

## 2012-07-17 ENCOUNTER — Encounter (HOSPITAL_COMMUNITY): Payer: Self-pay | Admitting: Obstetrics and Gynecology

## 2012-07-17 NOTE — Telephone Encounter (Signed)
Patient called.  Management reviewed.  We will discuss at her office visit.  Dr. Stefano Gaul

## 2012-07-17 NOTE — Anesthesia Postprocedure Evaluation (Signed)
Anesthesia Post Note  Patient: Diane [name]  Procedure(s) Performed: Procedure(s) (LRB): DILATATION AND EVACUATION (N/A)  Anesthesia type: MAC  Patient location: PACU  Post pain: Pain level controlled  Post assessment: Post-op Vital signs reviewed  Last Vitals:  Filed Vitals:   07/16/12 1800  BP: 103/51  Pulse: 55  Temp: 36.9 C  Resp: 19    Post vital signs: Reviewed  Level of consciousness: sedated  Complications: No apparent anesthesia complications

## 2012-08-06 ENCOUNTER — Ambulatory Visit (INDEPENDENT_AMBULATORY_CARE_PROVIDER_SITE_OTHER): Payer: BC Managed Care – PPO | Admitting: Obstetrics and Gynecology

## 2012-08-06 ENCOUNTER — Encounter: Payer: Self-pay | Admitting: Obstetrics and Gynecology

## 2012-08-06 VITALS — BP 98/60 | Temp 98.6°F | Ht 64.0 in | Wt 140.0 lb

## 2012-08-06 DIAGNOSIS — O039 Complete or unspecified spontaneous abortion without complication: Secondary | ICD-10-CM

## 2012-08-06 NOTE — Progress Notes (Signed)
DATE OF SURGERY: 07/16/12 TYPE OF SURGERY:DF&E PAIN:No VAG BLEEDING: no VAG DISCHARGE: no NORMAL GI FUNCTN: yes NORMAL GU FUNCTN: yes Pt without c/o BP 98/60  Temp 98.6 F (37 C)  Ht 5\' 4"  (1.626 m)  Wt 140 lb (63.504 kg)  BMI 24.03 kg/m2  LMP 05/10/2012  Physical Examination: General appearance - alert, well appearing, and in no distress Abdomen - soft, nontender, nondistended, no masses or organomegaly Physical Examination: Pelvic - normal external genitalia, vulva, vagina, cervix, uterus and adnexa    FINAL for Diane [name], Diane [name] ([id number]) Patient Name: Diane [name], Diane [name] Accession #: [id number] DOB: 1976-06-25 Age: 36 Gender: F Client Name [facility] Collected Date: 07/17/2012 Received Date: 07/17/2012 Physician: Osborn Coho Chart #: MRN # : [id number] Physician cc: Race:W Visit #: [id number] REPORT OF SURGICAL PATHOLOGY FINAL DIAGNOSIS Diagnosis Products of Conception - CHORIONIC VILLI IDENTIFIED. Pecola Leisure MD Pathologist, Electronic Signature (Case signed 07/19/2012) Specimen Gross and Clinical Information Specimen(s) Obtained: Products of Conception Specimen Clinical Information missed abortion, 7-weeks, twins [jl] Gross Received in formalin within a tissue trap is a 3.8 x 3.8 x 2.0 cm aggregate of soft, pink tan tissue which includes rare papillary fragments suggestive of chorionic villi. Fetal structures are not identified. A portion is submitted in one block. (TB:eps 07/17/12) Report signed out from the following location(s) [facility] [address] [place], [place], [place] [address]. CLIA #: [id number], [facility] [address], [place], [place] [address]. CLIA #: [id number], S/p D&E for missed abortion Pt will wait 3 months to try again Take PNV Check bhcg quant after last miscarriage pt did not have a menses for 4 months and her betas decreased slowly Pt with good emotional support

## 2012-08-07 LAB — HCG, QUANTITATIVE, PREGNANCY: hCG, Beta Chain, Quant, S: 5.7 m[IU]/mL

## 2012-08-10 ENCOUNTER — Encounter: Payer: Self-pay | Admitting: Obstetrics and Gynecology

## 2012-08-14 ENCOUNTER — Telehealth: Payer: Self-pay | Admitting: Obstetrics and Gynecology

## 2012-08-14 ENCOUNTER — Other Ambulatory Visit: Payer: Self-pay | Admitting: Obstetrics and Gynecology

## 2012-08-14 DIAGNOSIS — O039 Complete or unspecified spontaneous abortion without complication: Secondary | ICD-10-CM

## 2012-08-14 NOTE — Telephone Encounter (Signed)
TC to pt. Per DR ND scheduled repeat [employer] 08/15/12.

## 2012-08-14 NOTE — Telephone Encounter (Signed)
Message copied by Mason Jim on Wed Aug 14, 2012 11:37 AM ------      Message from: [name]      Created: Wed Aug 14, 2012  3:06 AM       One more bhcg in one week      ----- Message -----         From: Constance Haw, RN         Sent: 08/13/2012   9:46 AM           To: Michael Litter, MD            Pt's last QHCG on 08/06/12 was 5.7.  Does she need further testing or F/U?

## 2012-08-15 ENCOUNTER — Other Ambulatory Visit: Payer: BC Managed Care – PPO

## 2012-08-15 DIAGNOSIS — O30019 Twin pregnancy, monochorionic/monoamniotic, unspecified trimester: Secondary | ICD-10-CM

## 2012-08-15 DIAGNOSIS — O200 Threatened abortion: Secondary | ICD-10-CM

## 2012-08-16 ENCOUNTER — Telehealth: Payer: Self-pay | Admitting: Obstetrics and Gynecology

## 2012-08-16 LAB — HCG, QUANTITATIVE, PREGNANCY: hCG, Beta Chain, Quant, S: <2 m[IU]/mL

## 2012-08-16 NOTE — Telephone Encounter (Signed)
Spoke with pt rgd msg. Pt stated she wanted her quant results. Advised pt that her labs was <2.0. pts voice understanding. bt cma

## 2012-10-19 ENCOUNTER — Other Ambulatory Visit: Payer: Self-pay

## 2013-04-30 ENCOUNTER — Encounter (HOSPITAL_COMMUNITY): Payer: Self-pay | Admitting: *Deleted

## 2013-06-13 LAB — OB RESULTS CONSOLE HIV ANTIBODY (ROUTINE TESTING): HIV: NONREACTIVE

## 2013-06-13 LAB — OB RESULTS CONSOLE HEPATITIS B SURFACE ANTIGEN: Hepatitis B Surface Ag: NEGATIVE

## 2013-06-13 LAB — OB RESULTS CONSOLE RPR: RPR: NONREACTIVE

## 2013-06-13 LAB — OB RESULTS CONSOLE GBS: GBS: POSITIVE

## 2013-06-13 LAB — OB RESULTS CONSOLE RUBELLA ANTIBODY, IGM: RUBELLA: IMMUNE

## 2013-06-13 LAB — OB RESULTS CONSOLE ANTIBODY SCREEN: Antibody Screen: NEGATIVE

## 2013-06-13 LAB — OB RESULTS CONSOLE ABO/RH: RH Type: POSITIVE

## 2013-06-23 LAB — OB RESULTS CONSOLE GC/CHLAMYDIA
Chlamydia: NEGATIVE
GC PROBE AMP, GENITAL: NEGATIVE

## 2013-07-10 ENCOUNTER — Other Ambulatory Visit: Payer: Self-pay

## 2013-09-04 NOTE — L&D Delivery Note (Signed)
Delivery Note  First Stage: Labor onset: 1900 - active labor at 2200 Augmentation : none Analgesia /Anesthesia intrapartum: none AROM at 0302  Second Stage: Complete dilation at 0346 Onset of pushing at 0346 FHR 135 with moderate variability - intermittent EFM  Delivery of a viable female at [id number] by CNM in LOA position no nuchal cord - loose cord around foot x 1 noted cord double clamped after cessation of pulsation, cut by FOB  Baby passed to Dad for skin to skin for bonding to assist Mom out of tub Decreased color and tone to newborn ~ 9 minutes of age - HR low less than 100 Stimulation as transferred to warmer for assessment and resuscitation - Code Apgar called at 10 minutes O2 initiated NICU team arrival at 11 minutes of age and assumed care of newborn  Apgar 7-8 then 2 at 10 minutes   Apgar at 15 minutes - 9 per NICU team  Patient assisted out of tub to bed ~ 0405  Arterial cord blood sample collected and sent to lab  (7.27) Cord blood sample collected   Third Stage: [facility] with 3 VC @ 0408 Placenta disposition: to send home with patient Uterine tone firm / bleeding moderate initially then small  1st degree laceration identified with small extension into vagina midline on vaginal floor Anesthesia for repair: 1% xylocaine local Repair 3-0 vicryl vaginal repair and 4-0 subcuticular perineal Est. Blood Loss (mL): 400  Complications: newborn apneic event requiring resuscitation and NICU admit for observation  Mom to postpartum.  Baby to NICU.  Newborn: Birth Weight: 9-2  Apgar Scores: 7-8 then 2 then 9 Feeding planned: breast  Diane [name] CNM, MSN, [facility] 01/09/2014, 5:19 AM

## 2013-11-03 ENCOUNTER — Encounter (HOSPITAL_COMMUNITY): Payer: Self-pay | Admitting: Emergency Medicine

## 2013-11-03 ENCOUNTER — Emergency Department (HOSPITAL_COMMUNITY)
Admission: EM | Admit: 2013-11-03 | Discharge: 2013-11-03 | Disposition: A | Discharge status: Home / Self Care | Payer: BC Managed Care – PPO | Attending: Emergency Medicine | Admitting: Emergency Medicine

## 2013-11-03 ENCOUNTER — Emergency Department (HOSPITAL_COMMUNITY): Payer: BC Managed Care – PPO

## 2013-11-03 DIAGNOSIS — R05 Cough: Secondary | ICD-10-CM | POA: Insufficient documentation

## 2013-11-03 DIAGNOSIS — R0602 Shortness of breath: Secondary | ICD-10-CM | POA: Insufficient documentation

## 2013-11-03 DIAGNOSIS — O9989 Other specified diseases and conditions complicating pregnancy, childbirth and the puerperium: Secondary | ICD-10-CM | POA: Insufficient documentation

## 2013-11-03 DIAGNOSIS — R059 Cough, unspecified: Secondary | ICD-10-CM | POA: Insufficient documentation

## 2013-11-03 DIAGNOSIS — Z8639 Personal history of other endocrine, nutritional and metabolic disease: Secondary | ICD-10-CM | POA: Insufficient documentation

## 2013-11-03 DIAGNOSIS — Z8781 Personal history of (healed) traumatic fracture: Secondary | ICD-10-CM | POA: Insufficient documentation

## 2013-11-03 DIAGNOSIS — R0789 Other chest pain: Secondary | ICD-10-CM | POA: Insufficient documentation

## 2013-11-03 DIAGNOSIS — Z3490 Encounter for supervision of normal pregnancy, unspecified, unspecified trimester: Secondary | ICD-10-CM

## 2013-11-03 DIAGNOSIS — Z862 Personal history of diseases of the blood and blood-forming organs and certain disorders involving the immune mechanism: Secondary | ICD-10-CM | POA: Insufficient documentation

## 2013-11-03 DIAGNOSIS — Z87440 Personal history of urinary (tract) infections: Secondary | ICD-10-CM | POA: Insufficient documentation

## 2013-11-03 DIAGNOSIS — O99019 Anemia complicating pregnancy, unspecified trimester: Secondary | ICD-10-CM | POA: Insufficient documentation

## 2013-11-03 DIAGNOSIS — Z79899 Other long term (current) drug therapy: Secondary | ICD-10-CM | POA: Insufficient documentation

## 2013-11-03 LAB — CBC WITH DIFFERENTIAL/PLATELET
BASOS ABS: 0 10*3/uL (ref 0.0–0.1)
BASOS PCT: 0 % (ref 0–1)
Eosinophils Absolute: 0.2 10*3/uL (ref 0.0–0.7)
Eosinophils Relative: 2 % (ref 0–5)
HCT: 32.7 % — ABNORMAL LOW (ref 36.0–46.0)
Hemoglobin: 11.6 g/dL — ABNORMAL LOW (ref 12.0–15.0)
Lymphocytes Relative: 17 % (ref 12–46)
Lymphs Abs: 1.9 10*3/uL (ref 0.7–4.0)
MCH: 31.4 pg (ref 26.0–34.0)
MCHC: 35.5 g/dL (ref 30.0–36.0)
MCV: 88.4 fL (ref 78.0–100.0)
Monocytes Absolute: 1.1 10*3/uL — ABNORMAL HIGH (ref 0.1–1.0)
Monocytes Relative: 10 % (ref 3–12)
NEUTROS PCT: 71 % (ref 43–77)
Neutro Abs: 7.9 10*3/uL — ABNORMAL HIGH (ref 1.7–7.7)
PLATELETS: 179 10*3/uL (ref 150–400)
RBC: 3.7 MIL/uL — ABNORMAL LOW (ref 3.87–5.11)
RDW: 12.5 % (ref 11.5–15.5)
WBC: 11.1 10*3/uL — ABNORMAL HIGH (ref 4.0–10.5)

## 2013-11-03 LAB — COMPREHENSIVE METABOLIC PANEL
ALBUMIN: 2.6 g/dL — AB (ref 3.5–5.2)
ALK PHOS: 103 U/L (ref 39–117)
ALT: 19 U/L (ref 0–35)
AST: 17 U/L (ref 0–37)
BILIRUBIN TOTAL: 0.2 mg/dL — AB (ref 0.3–1.2)
BUN: 6 mg/dL (ref 6–23)
CO2: 22 mEq/L (ref 19–32)
Calcium: 8.7 mg/dL (ref 8.4–10.5)
Chloride: 102 mEq/L (ref 96–112)
Creatinine, Ser: 0.56 mg/dL (ref 0.50–1.10)
GFR calc Af Amer: >90 mL/min (ref >90)
GFR calc non Af Amer: >90 mL/min (ref >90)
Glucose, Bld: 90 mg/dL (ref 70–99)
POTASSIUM: 3.8 meq/L (ref 3.7–5.3)
Sodium: 137 mEq/L (ref 137–147)
Total Protein: 6.6 g/dL (ref 6.0–8.3)

## 2013-11-03 MED ORDER — ALBUTEROL SULFATE (2.5 MG/3ML) 0.083% IN NEBU
2.5000 mg | INHALATION_SOLUTION | Freq: Once | RESPIRATORY_TRACT | Status: AC
  Administered 2013-11-03: 2.5 mg via RESPIRATORY_TRACT
  Filled 2013-11-03: qty 3

## 2013-11-03 MED ORDER — BENZONATATE 100 MG PO CAPS: 100.0000 mg | ORAL_CAPSULE | Freq: Three times a day (TID) | ORAL | Status: DC | PRN

## 2013-11-03 MED ORDER — BENZONATATE 100 MG PO CAPS
100.0000 mg | ORAL_CAPSULE | Freq: Once | ORAL | Status: AC
  Administered 2013-11-03: 100 mg via ORAL
  Filled 2013-11-03: qty 1

## 2013-11-03 MED ORDER — HYDROCODONE-HOMATROPINE 5-1.5 MG/5ML PO SYRP: 5.0000 mL | ORAL_SOLUTION | Freq: Four times a day (QID) | ORAL | Status: DC | PRN

## 2013-11-03 NOTE — ED Notes (Addendum)
Dr. Ranae PalmsYelverton at bedside. OB Nurse at bedside with monitor.

## 2013-11-03 NOTE — Progress Notes (Signed)
RROB called to monitor pt who is 29 wks with chest pain, coughing, and sob.

## 2013-11-03 NOTE — Progress Notes (Signed)
This note also relates to the following rows which could not be included: Pulse Rate - Cannot attach notes to unvalidated device data SpO2 - Cannot attach notes to unvalidated device data   Ed physician spoke with on call provider Rolitta Dawson,CNM to discuss plan of care.  Efm being discontinued, fhr reactive and reassurring.

## 2013-11-03 NOTE — ED Notes (Signed)
Pt reports being [redacted] weeks pregnant and just got back today from trip from [place] and complains of cough and sob.

## 2013-11-03 NOTE — ED Notes (Signed)
OB nurse at bedside 

## 2013-11-03 NOTE — ED Notes (Signed)
Respiratory paged to reassess pt from Albuterol treatment.
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
## 2013-11-03 NOTE — ED Provider Notes (Signed)
CSN: [id number]     Arrival date & time 11/03/13  0316 History   First MD Initiated Contact with Patient 11/03/13 [phone]     Chief Complaint  Patient presents with  . Shortness of Breath     (Consider location/radiation/quality/duration/timing/severity/associated sxs/prior Treatment) HPI Patient is 5329 week pregnant female presents with persistent cough as her main complaint. She states that she has been unable to sleep due to the coughing. It is nonproductive and hacking. She's had roughly one week of upper respiratory symptoms including nasal congestion and sore throat. This has progressed to cough and mild shortness of breath the last few days. The patient does have mild central chest pain especially with cough. She has no lower extremity swelling or pain. She [place] but her symptoms started prior to boarding the plane. She's been contact with her midwife who prescribed Mucinex. Patient states she's had good fetal movements. She's had no loss of fluids. She's had no vaginal bleeding. She denies any abdominal or pelvic pain. Past Medical History  Diagnosis Date  . Thyroid cyst 2007     NO MEDS  . Blood transfusion without reported diagnosis     PP 2012  . Abnormal Pap smear     REPEAT NORMAL; LAST PAP 12/2010  . Infection     UTI OCC  . Urinary reflux CHILDHOOD  . Anemia     DURING PREG  . Bronchitis     WINTER  . Arm fracture, left AGE 59  . PONV (postoperative nausea and vomiting)    Past Surgical History  Procedure Laterality Date  . Appendectomy  AGE 73  . Thyroid surgery  2007  . Wisdom tooth extraction  AGE 89  . Dilation and evacuation  07/16/2012    Procedure: DILATATION AND EVACUATION;  Surgeon: Purcell NailsAngela Y Roberts, MD;  Location: [facility];  Service: Gynecology;  Laterality: N/A;   Family History  Problem Relation Age of Onset  . Rheum arthritis Mother   . Other Mother     PULMONARY FIBROSIS; VARICOSE VEINS  . Cancer Mother     MELANOMA  .  Hypertension Mother   . Arthritis Father   . Hearing loss Father   . Hyperlipidemia Father   . Hypertension Father   . Other Sister     VARCOSE VEINS  . Kidney disease Sister     URINARY REFLUX  . Kidney disease Daughter     URINARY REFLUX  . Heart disease Maternal Uncle   . Miscarriages / Stillbirths Maternal Grandmother   . Heart disease Maternal Grandfather   . Cancer Paternal Grandmother     BREAST  . Hearing loss Paternal Grandmother   . Cancer Paternal Grandfather     LUNG  . Thyroid disease Sister   . Kidney disease Sister     URINARY REFLUX  . Kidney disease Other     URINARY REFLUX   History  Substance Use Topics  . Smoking status: Never Smoker   . Smokeless tobacco: Never Used  . Alcohol Use: 3.5 oz/week    7 drink(s) per week     Comment: WINE; D/C'D WITH +UPT   OB History   Grav Para Term Preterm Abortions TAB SAB Ect Mult Living   4 2 2  1  1   2      Obstetric Comments   2012 3 DAY PP HEMORRHAGE; LOW HG - BLOOD TRANSFUSION     Review of Systems  Constitutional: Negative for fever  and chills.  HENT: Positive for congestion. Negative for rhinorrhea, sinus pressure and sore throat.   Respiratory: Positive for cough and shortness of breath. Negative for chest tightness and wheezing.   Cardiovascular: Positive for chest pain. Negative for palpitations and leg swelling.  Gastrointestinal: Negative for nausea, vomiting, abdominal pain, diarrhea and constipation.  Genitourinary: Negative for vaginal bleeding, vaginal discharge, vaginal pain and pelvic pain.  Musculoskeletal: Negative for back pain, neck pain and neck stiffness.  Skin: Negative for rash and wound.  Neurological: Negative for dizziness, weakness, light-headedness, numbness and headaches.  All other systems reviewed and are negative.      Allergies  Review of patient's allergies indicates no known allergies.  Home Medications   Current Outpatient Rx  Name  Route  Sig  Dispense  Refill   . acetaminophen (TYLENOL) 500 MG tablet   Oral   Take 1,000 mg by mouth every 6 (six) hours as needed for moderate pain.         Marland Kitchen CALCIUM PO   Oral   Take 1 tablet by mouth daily.         . diphenhydrAMINE (BENADRYL) 25 MG tablet   Oral   Take 50 mg by mouth at bedtime as needed for sleep.         Marland Kitchen guaiFENesin (MUCINEX) 600 MG 12 hr tablet   Oral   Take 1,200 mg by mouth 2 (two) times daily as needed for cough.         . Prenatal Vit-Fe Fumarate-FA (PRENATAL MULTIVITAMIN) TABS   Oral   Take 1 tablet by mouth daily.         . ranitidine (ZANTAC) 150 MG tablet   Oral   Take 150 mg by mouth daily.          BP 128/60  Pulse 98  Temp(Src) 97.4 F (36.3 C) (Oral)  Resp 18  Ht 5\' 4"  (1.626 m)  Wt 174 lb (78.926 kg)  BMI 29.85 kg/m2  SpO2 100% Physical Exam  Nursing note and vitals reviewed. Constitutional: She is oriented to person, place, and time. She appears well-developed and well-nourished. No distress.  HENT:  Head: Normocephalic and atraumatic.  Mouth/Throat: Oropharynx is clear and moist.  Eyes: EOM are normal. Pupils are equal, round, and reactive to light.  Neck: Normal range of motion. Neck supple.  Cardiovascular: Normal rate and regular rhythm.   Pulmonary/Chest: Effort normal and breath sounds normal. No respiratory distress. She has no wheezes. She has no rales. She exhibits tenderness (central chest tenderness with palpation of the anterior chest. No crepitance or deformity.).  Abdominal: Soft. Bowel sounds are normal. She exhibits no distension and no mass. There is no tenderness. There is no rebound and no guarding.  Gravid abdomen without tenderness.  Musculoskeletal: Normal range of motion. She exhibits no edema and no tenderness.  No calf tenderness or pain.  Neurological: She is alert and oriented to person, place, and time.  Patient is alert and oriented x3 with clear, goal oriented speech. Patient has 5/5 motor in all extremities.  Sensation is intact to light touch.   Skin: Skin is warm and dry. No rash noted. No erythema.  Psychiatric: She has a normal mood and affect. Her behavior is normal.    ED Course  Procedures (including critical care time) Labs Review Labs Reviewed  CBC WITH DIFFERENTIAL - Abnormal; Notable for the following:    WBC 11.1 (*)    RBC 3.70 (*)    Hemoglobin  11.6 (*)    HCT 32.7 (*)    Neutro Abs 7.9 (*)    Monocytes Absolute 1.1 (*)    All other components within normal limits  COMPREHENSIVE METABOLIC PANEL - Abnormal; Notable for the following:    Albumin 2.6 (*)    Total Bilirubin 0.2 (*)    All other components within normal limits   Imaging Review Dg Chest 2 View  11/03/2013   CLINICAL DATA:  Cough for 5 days, assess for pneumonia.  EXAM: CHEST  2 VIEW  COMPARISON:  None available for comparison at time of study interpretation.  FINDINGS: Cardiac silhouette is mildly enlarged, mediastinal silhouette is nonsuspicious. No pleural effusions or focal consolidations. No pneumothorax.  Abdominal shield. Soft tissue planes and included osseous structures are nonsuspicious.  IMPRESSION: Mild cardiomegaly, no acute pulmonary process.   Electronically Signed   By: Awilda Metro   On: 11/03/2013 04:10     EKG Interpretation   Date/Time:  Monday November 03 2013 03:25:19 EST Ventricular Rate:  90 PR Interval:  120 QRS Duration: 90 QT Interval:  357 QTC Calculation: 437 R Axis:   19 Text Interpretation:  Sinus rhythm Low voltage, precordial leads  Borderline repolarization abnormality Baseline wander in lead(s) III aVF  Confirmed by Ranae Palms  MD, Karle Desrosier ([id number]) on 11/03/2013 5:57:10 AM      MDM   Final diagnoses:  None   I have very low suspicion for PE. Her symptoms are consistent with infectious etiology. They have been present before she was on her flight. Her predominant symptom is cough. Her chest pain is reproducible and is musculoskeletal in origin. She has no lower extremity  swelling or pain. Her heart rate is below 100. She's satting 100% on room air. I discussed with the patient my low suspicion. She agrees with the plan not to work her up further for pulmonary embolism. Chest x-ray was obtained to rule out pneumonia. Patient understood the risk involved with a low levels of radiation. I talked with the patient's midwife. She's been in communication with the patient regarding her respiratory illness. She advises nebulized treatment and possibly Tessalon versus Hycodan for cough control. She urges the patient to followup with her.  Patient with no acute improvement in her cough. She is resting comfortably. She is in no distress. Vital signs remained stable in the emergency department. He was monitored without any events. Patient be discharged home with cough medication and advised to followup with her OB/GYN. She is agreeable with plan. Return precautions have been given.  Loren Racer, MD 11/03/13 [phone]

## 2014-01-08 ENCOUNTER — Encounter (HOSPITAL_COMMUNITY): Payer: Self-pay

## 2014-01-08 ENCOUNTER — Inpatient Hospital Stay (HOSPITAL_COMMUNITY)
Admission: AD | Admit: 2014-01-08 | Discharge: 2014-01-11 | DRG: 775 | Disposition: A | Discharge status: Home / Self Care | Payer: BC Managed Care – PPO | Source: Ambulatory Visit | Attending: Obstetrics & Gynecology | Admitting: Obstetrics & Gynecology

## 2014-01-08 DIAGNOSIS — O99892 Other specified diseases and conditions complicating childbirth: Secondary | ICD-10-CM | POA: Diagnosis present

## 2014-01-08 DIAGNOSIS — Z808 Family history of malignant neoplasm of other organs or systems: Secondary | ICD-10-CM

## 2014-01-08 DIAGNOSIS — O9989 Other specified diseases and conditions complicating pregnancy, childbirth and the puerperium: Secondary | ICD-10-CM

## 2014-01-08 DIAGNOSIS — O09529 Supervision of elderly multigravida, unspecified trimester: Secondary | ICD-10-CM | POA: Diagnosis present

## 2014-01-08 DIAGNOSIS — Z22330 Carrier of Group B streptococcus: Secondary | ICD-10-CM

## 2014-01-08 DIAGNOSIS — Z8249 Family history of ischemic heart disease and other diseases of the circulatory system: Secondary | ICD-10-CM

## 2014-01-08 NOTE — MAU Note (Signed)
contractions 

## 2014-01-08 NOTE — MAU Provider Note (Signed)
History     CSN: [id number]  Arrival date and time: 01/08/14 2304 Provider here to see patient @ 2340     Chief Complaint  Patient presents with  . Labor Eval   HPI Ctx since 1900 tonight stronger and closer in past 2 hours Some bloody show No LOF  Past Medical History  Diagnosis Date  . Thyroid cyst 2007     NO MEDS  . Blood transfusion without reported diagnosis     PP 2012  . Abnormal Pap smear     REPEAT NORMAL; LAST PAP 12/2010  . Infection     UTI OCC  . Urinary reflux CHILDHOOD  . Anemia     DURING PREG  . Bronchitis     WINTER  . Arm fracture, left AGE 73  . PONV (postoperative nausea and vomiting)     Past Surgical History  Procedure Laterality Date  . Appendectomy  AGE 49  . Thyroid surgery  2007  . Wisdom tooth extraction  AGE 36  . Dilation and evacuation  07/16/2012    Procedure: DILATATION AND EVACUATION;  Surgeon: Purcell NailsAngela Y Roberts, MD;  Location: [facility];  Service: Gynecology;  Laterality: N/A;    Family History  Problem Relation Age of Onset  . Rheum arthritis Mother   . Other Mother     PULMONARY FIBROSIS; VARICOSE VEINS  . Cancer Mother     MELANOMA  . Hypertension Mother   . Arthritis Father   . Hearing loss Father   . Hyperlipidemia Father   . Hypertension Father   . Other Sister     VARCOSE VEINS  . Kidney disease Sister     URINARY REFLUX  . Kidney disease Daughter     URINARY REFLUX  . Heart disease Maternal Uncle   . Miscarriages / Stillbirths Maternal Grandmother   . Heart disease Maternal Grandfather   . Cancer Paternal Grandmother     BREAST  . Hearing loss Paternal Grandmother   . Cancer Paternal Grandfather     LUNG  . Thyroid disease Sister   . Kidney disease Sister     URINARY REFLUX  . Kidney disease Other     URINARY REFLUX    History  Substance Use Topics  . Smoking status: Never Smoker   . Smokeless tobacco: Never Used  . Alcohol Use: 3.5 oz/week    7 drink(s) per week     Comment: WINE; D/C'D WITH  +UPT    Allergies: No Known Allergies  Prescriptions prior to admission  Medication Sig Dispense Refill  . CALCIUM PO Take 1 tablet by mouth daily.      . Prenatal Vit-Fe Fumarate-FA (PRENATAL MULTIVITAMIN) TABS Take 1 tablet by mouth daily.      . ranitidine (ZANTAC) 150 MG tablet Take 150 mg by mouth daily.      Marland Kitchen. acetaminophen (TYLENOL) 500 MG tablet Take 1,000 mg by mouth every 6 (six) hours as needed for moderate pain.      . benzonatate (TESSALON) 100 MG capsule Take 1 capsule (100 mg total) by mouth 3 (three) times daily as needed for cough.  21 capsule  0  . diphenhydrAMINE (BENADRYL) 25 MG tablet Take 50 mg by mouth at bedtime as needed for sleep.      Marland Kitchen. guaiFENesin (MUCINEX) 600 MG 12 hr tablet Take 1,200 mg by mouth 2 (two) times daily as needed for cough.      Marland Kitchen. HYDROcodone-homatropine (HYCODAN) 5-1.5 MG/5ML syrup Take 5 mLs by  mouth every 6 (six) hours as needed for cough.  120 mL  0    ROS Physical Exam   Blood pressure 132/72, pulse 89, resp. rate 18, height 5\' 4"  (1.626 m), weight 85.276 kg (188 lb), SpO2 99.00%, unknown if currently breastfeeding.  Physical Exam  Calm and comfortable - breathing with ctx Abdomen soft and non-tender VE: 4 / 90% / vtx / -1 / BBOW MAU Course  Procedures  NST - reactive Assessment and Plan  39 weeks onset of labor Latent labor - desires water birth  Diane [name] Grave 01/08/2014, 11:41 PM

## 2014-01-08 NOTE — H&P (Signed)
OB ADMISSION/ HISTORY & PHYSICAL:  Admission Date: 01/08/2014 11:04 PM  Admit Diagnosis: 39 weeks / latent labor  Diane [name] is a 38 y.o. female presenting for onset of labor at 571900.  Prenatal History: [id number]   EDC : 01/15/2014, by Other Basis  [facility]  Primary Ob Provider: [name] CNM Prenatal course complicated by AMA / + GBS  Prenatal Labs: ABO, Rh:  AB positive Antibody:  negative Rubella:   Immune RPR:   NR HBsAg:   negative HIV:   NR GTT: NL GBS:   Positive (bacturia)  Medical / Surgical History :  Past medical history:  Past Medical History  Diagnosis Date  . Thyroid cyst 2007     NO MEDS  . Blood transfusion without reported diagnosis     PP 2012  . Abnormal Pap smear     REPEAT NORMAL; LAST PAP 12/2010  . Infection     UTI OCC  . Urinary reflux CHILDHOOD  . Anemia     DURING PREG  . Bronchitis     WINTER  . Arm fracture, left AGE 5  . PONV (postoperative nausea and vomiting)      Past surgical history:  Past Surgical History  Procedure Laterality Date  . Appendectomy  AGE 56  . Thyroid surgery  2007  . Wisdom tooth extraction  AGE 53  . Dilation and evacuation  07/16/2012    Procedure: DILATATION AND EVACUATION;  Surgeon: Purcell NailsAngela Y Roberts, MD;  Location: [facility];  Service: Gynecology;  Laterality: N/A;    Family History:  Family History  Problem Relation Age of Onset  . Rheum arthritis Mother   . Other Mother     PULMONARY FIBROSIS; VARICOSE VEINS  . Cancer Mother     MELANOMA  . Hypertension Mother   . Arthritis Father   . Hearing loss Father   . Hyperlipidemia Father   . Hypertension Father   . Other Sister     VARCOSE VEINS  . Kidney disease Sister     URINARY REFLUX  . Kidney disease Daughter     URINARY REFLUX  . Heart disease Maternal Uncle   . Miscarriages / Stillbirths Maternal Grandmother   . Heart disease Maternal Grandfather   . Cancer Paternal Grandmother     BREAST  .  Hearing loss Paternal Grandmother   . Cancer Paternal Grandfather     LUNG  . Thyroid disease Sister   . Kidney disease Sister     URINARY REFLUX  . Kidney disease Other     URINARY REFLUX     Social History:  reports that she has never smoked. She has never used smokeless tobacco. She reports that she drinks about 3.5 ounces of alcohol per week. She reports that she does not use illicit drugs.   Allergies: Review of patient's allergies indicates no known allergies.    Current Medications at time of admission:  Prior to Admission medications   Medication Sig Start Date End Date Taking? Authorizing Provider  CALCIUM PO Take 1 tablet by mouth daily.   Yes Historical Provider, MD  Prenatal Vit-Fe Fumarate-FA (PRENATAL MULTIVITAMIN) TABS Take 1 tablet by mouth daily.   Yes Historical Provider, MD  ranitidine (ZANTAC) 150 MG tablet Take 150 mg by mouth daily.   Yes Historical Provider, MD  acetaminophen (TYLENOL) 500 MG tablet Take 1,000 mg by mouth every 6 (six) hours as needed for moderate pain.    Historical Provider, MD  benzonatate (TESSALON) 100 MG capsule Take 1 capsule (100 mg total) by mouth 3 (three) times daily as needed for cough. 11/03/13   Loren Raceravid Yelverton, MD  diphenhydrAMINE (BENADRYL) 25 MG tablet Take 50 mg by mouth at bedtime as needed for sleep.    Historical Provider, MD  guaiFENesin (MUCINEX) 600 MG 12 hr tablet Take 1,200 mg by mouth 2 (two) times daily as needed for cough.    Historical Provider, MD  HYDROcodone-homatropine (HYCODAN) 5-1.5 MG/5ML syrup Take 5 mLs by mouth every 6 (six) hours as needed for cough. 11/03/13   Loren Raceravid Yelverton, MD    Review of Systems: Active FM onset of ctx @ 1900 currently every 2-5 minutes bloody show small pink  Physical Exam:  VS: Blood pressure 132/72, pulse 89, resp. rate 18, height 5\' 4"  (1.626 m), weight 85.276 kg (188 lb), SpO2 99.00%, unknown if currently breastfeeding.  General: alert and oriented, appears calm Heart:  RRR Lungs: Clear lung fields Abdomen: Gravid, soft and non-tender, non-distended / uterus: gravid and non-tender Extremities: trace edema  Genitalia / VE: Dilation: 4 Effacement (%): 80 Station: -2 Exam by:: T [name] CNM  FHR: baseline rate 135 / variability moderaet / accelerations + / no decelerations TOCO: ctx every 2-6 minutes  Assessment: [redacted] weeks gestation latent stage of labor FHR category 1   Plan:  Admit PCN protocol for GBS Sweep membranes - AROM in active labor after water immersion Water immersion in labor and planned water birth  Dr Juliene PinaMody notified of admission / plan of care   Marlinda Mikeanya Keygan Dumond CNM, MSN, [facility] 01/08/2014, 11:45 PM

## 2014-01-09 ENCOUNTER — Encounter (HOSPITAL_COMMUNITY): Payer: Self-pay | Admitting: Certified Nurse Midwife

## 2014-01-09 LAB — CBC
HCT: 38.7 % (ref 36.0–46.0)
Hemoglobin: 13.5 g/dL (ref 12.0–15.0)
MCH: 30.6 pg (ref 26.0–34.0)
MCHC: 34.9 g/dL (ref 30.0–36.0)
MCV: 87.8 fL (ref 78.0–100.0)
Platelets: 228 10*3/uL (ref 150–400)
RBC: 4.41 MIL/uL (ref 3.87–5.11)
RDW: 14.2 % (ref 11.5–15.5)
WBC: 10.9 10*3/uL — ABNORMAL HIGH (ref 4.0–10.5)

## 2014-01-09 LAB — RPR

## 2014-01-09 MED ORDER — LIDOCAINE HCL (PF) 1 % IJ SOLN
30.0000 mL | INTRAMUSCULAR | Status: DC | PRN
  Administered 2014-01-09: 30 mL via SUBCUTANEOUS
  Filled 2014-01-09: qty 30

## 2014-01-09 MED ORDER — OXYTOCIN 40 UNITS IN LACTATED RINGERS INFUSION - SIMPLE MED
62.5000 mL/h | INTRAVENOUS | Status: DC
  Filled 2014-01-09: qty 1000

## 2014-01-09 MED ORDER — SODIUM CHLORIDE 0.9 % IJ SOLN
3.0000 mL | Freq: Two times a day (BID) | INTRAMUSCULAR | Status: DC
Start: 2014-01-09 — End: 2014-01-09

## 2014-01-09 MED ORDER — NALBUPHINE HCL 10 MG/ML IJ SOLN: 5.0000 mg | INTRAMUSCULAR | Status: DC | PRN

## 2014-01-09 MED ORDER — ACETAMINOPHEN 500 MG PO TABS: 1000.0000 mg | ORAL_TABLET | Freq: Four times a day (QID) | ORAL | Status: DC | PRN

## 2014-01-09 MED ORDER — DIBUCAINE 1 % RE OINT: 1.0000 "application " | TOPICAL_OINTMENT | RECTAL | Status: DC | PRN

## 2014-01-09 MED ORDER — OXYTOCIN BOLUS FROM INFUSION
500.0000 mL | INTRAVENOUS | Status: DC
  Administered 2014-01-09: 500 mL via INTRAVENOUS

## 2014-01-09 MED ORDER — IBUPROFEN 600 MG PO TABS
600.0000 mg | ORAL_TABLET | Freq: Four times a day (QID) | ORAL | Status: DC
  Administered 2014-01-09 – 2014-01-11 (×9): 600 mg via ORAL
  Filled 2014-01-09 (×9): qty 1

## 2014-01-09 MED ORDER — SIMETHICONE 80 MG PO CHEW: 80.0000 mg | CHEWABLE_TABLET | ORAL | Status: DC | PRN

## 2014-01-09 MED ORDER — BENZOCAINE-MENTHOL 20-0.5 % EX AERO
1.0000 "application " | INHALATION_SPRAY | CUTANEOUS | Status: DC | PRN
  Administered 2014-01-09: 1 via TOPICAL
  Filled 2014-01-09: qty 56

## 2014-01-09 MED ORDER — SODIUM CHLORIDE 0.9 % IJ SOLN: 3.0000 mL | INTRAMUSCULAR | Status: DC | PRN

## 2014-01-09 MED ORDER — PENICILLIN G POTASSIUM 5000000 UNITS IJ SOLR
5.0000 10*6.[IU] | Freq: Once | INTRAVENOUS | Status: AC
  Administered 2014-01-09: 5 10*6.[IU] via INTRAVENOUS
  Filled 2014-01-09: qty 5

## 2014-01-09 MED ORDER — SENNOSIDES-DOCUSATE SODIUM 8.6-50 MG PO TABS
2.0000 | ORAL_TABLET | ORAL | Status: DC
  Administered 2014-01-10 (×2): 2 via ORAL
  Filled 2014-01-09 (×2): qty 2

## 2014-01-09 MED ORDER — IBUPROFEN 600 MG PO TABS
600.0000 mg | ORAL_TABLET | Freq: Four times a day (QID) | ORAL | Status: DC | PRN
  Administered 2014-01-09: 600 mg via ORAL
  Filled 2014-01-09: qty 1

## 2014-01-09 MED ORDER — LACTATED RINGERS IV SOLN
500.0000 mL | INTRAVENOUS | Status: DC | PRN
Start: 2014-01-09 — End: 2014-01-09

## 2014-01-09 MED ORDER — DIPHENHYDRAMINE HCL 25 MG PO CAPS: 25.0000 mg | ORAL_CAPSULE | Freq: Four times a day (QID) | ORAL | Status: DC | PRN

## 2014-01-09 MED ORDER — PENICILLIN G POTASSIUM 5000000 UNITS IJ SOLR
2.5000 10*6.[IU] | INTRAVENOUS | Status: DC
  Filled 2014-01-09 (×4): qty 2.5

## 2014-01-09 MED ORDER — LACTATED RINGERS IV SOLN
Freq: Once | INTRAVENOUS | Status: AC
  Administered 2014-01-09: 02:00:00 via INTRAVENOUS

## 2014-01-09 MED ORDER — CITRIC ACID-SODIUM CITRATE 334-500 MG/5ML PO SOLN: 30.0000 mL | ORAL | Status: DC | PRN

## 2014-01-09 MED ORDER — LANOLIN HYDROUS EX OINT: TOPICAL_OINTMENT | CUTANEOUS | Status: DC | PRN

## 2014-01-09 MED ORDER — SODIUM CHLORIDE 0.9 % IV SOLN: 250.0000 mL | INTRAVENOUS | Status: DC | PRN

## 2014-01-09 MED ORDER — WITCH HAZEL-GLYCERIN EX PADS: 1.0000 "application " | MEDICATED_PAD | CUTANEOUS | Status: DC | PRN

## 2014-01-09 MED ORDER — OXYCODONE-ACETAMINOPHEN 5-325 MG PO TABS: 1.0000 | ORAL_TABLET | ORAL | Status: DC | PRN

## 2014-01-09 NOTE — H&P (Signed)
Reviewed and agree with note and plan. V.Klarisa Barman, MD  

## 2014-01-09 NOTE — Progress Notes (Signed)
Patient brought up to NICU to see infant at this time

## 2014-01-09 NOTE — Lactation Note (Signed)
This note was copied from the chart of Diane Louisa Secondracey Mullet. Lactation Consultation Note     Initial consult with this mom and term baby, in NICU. Baby is now 8 hours old, and term. Mom is an experienced breast feeder, but needed reminders of how to position and latch a newborn infant. The baby latched well, deeply with strong, rhythmic suckles, for 25 minutes. She self un-latched, and mom relatched her  independently, and again latch and feeding went well. Mom to be called for cue based feeding, by NICU nurse, while mom is in the hospital. Mom and dad are very against formula. I explained that if Diane [name] needs to stay beyond mom's days in the hospital, mom will not be able to breast feed the baby  around the clock. In that case, it is unlikely that mom would initially have enough EBM to feed the baby. Mom wanted the consent to use a friend's EBM, instead of formula, which I gave to her.  Mom's friend has begun pumping for her. i advised [facility] to [facility] about her decision to offer this donor milk. Mom encouraged to pump after breast feeding her baby, her goal being 8 times a day. Once discharged, mom will need to rent a DEP.   Patient Name: Diane [name] ZOXWR'UToday's Date: 01/09/2014     Maternal Data    Feeding Feeding Type: Breast Fed Length of feed: 30 min  LATCH Score/Interventions Latch: Repeated attempts needed to sustain latch, nipple held in mouth throughout feeding, stimulation needed to elicit sucking reflex.  Audible Swallowing: A few with stimulation  Type of Nipple: Everted at rest and after stimulation  Comfort (Breast/Nipple): Soft / non-tender     Hold (Positioning): No assistance needed to correctly position infant at breast.  LATCH Score: 8  Lactation Tools Discussed/Used     Consult Status      Diane [name] 01/09/2014, 4:13 PM

## 2014-01-09 NOTE — MAU Provider Note (Signed)
Reviewed and agree with note and plan. V.Pauletta Pickney, MD  

## 2014-01-09 NOTE — Progress Notes (Signed)
S:  Painful ctx with pressure in hips constant  O:  VS: Blood pressure 136/76, pulse 90, resp. rate 18, height 5\' 4"  (1.626 m), weight 85.276 kg (188 lb), SpO2 99.00%, unknown if currently breastfeeding.        FHR : baseline 135 / variability moderate / no audible decelerations        Toco: contractions every 2-3 minutes / strong         Cervix : 8/90% / vtx -1 BBOW        Membranes: AROM - large clear fluid  A: active labor      P: water immersion     continuous labor support     anticipate SVB in next 2 hours   Marlinda Mikeanya Ronell Boldin CNM, MSN, [facility] 01/09/2014, 3:29 AM

## 2014-01-09 NOTE — Progress Notes (Signed)
I visited with Ms Diane [name] and her family.  They are relieved that their daughter, Diane [name], is doing better.  This has been a scary and completely unexpected experience and they are processing all that has taken place.    They are aware of on-going availability of chaplain support.  Centex CorporationChaplain Katy Khrystal Jeanmarie Pager, [phone] 3:11 PM   01/09/14 1500  Clinical Encounter Type  Visited With Patient and family together  Visit Type Spiritual support;Initial

## 2014-01-09 NOTE — Consult Note (Addendum)
[facility]  Delivery Note:  Vaginal Birth        01/09/2014  4:26 AM  I was called to Labor and Delivery at request of the patient's midwife (Tanya Bailey) for apparent apnea in the first 10 minutes of life.  PRENATAL HX:    37 y/o G4P2012 who presented to MAU in active labor at 39 and 2/[redacted] weeks gestation.  Pregnancy complicated by AMA and GBS+.    INTRAPARTUM HX:   Infant delivered in planned water birth.  Mother only received one dose of PCN ~3 hours prior to delivery, so GBS coverage was not adequate.  DELIVERY:   NICU team not present for delivery.  Per report, infant was breathing spontaneously with good HR, but was not crying.  At around 10 minutes of age she went limp and had HR < 100 so was taken to the warmer and a code APGAR was called.  Upon arrival, infant had poor color, poor tone, and HR < 100.  PPV initiated.  The infant became responsive within 30 seconds with good cry, HR >100, and good color.  Exam was within normal limits.  APGARs 6, 4, and 4.  In the setting of apnea and inadequate GBS prophylaxis, will admit to NICU for sepsis evaluation and further CR monitoring.  Because infant was well appearing, she went skin to skin prior to transfer to NICU.    ____________________ Electronically Signed By: Almee Pelphrey, MD 

## 2014-01-10 LAB — CBC
HCT: 33.1 % — ABNORMAL LOW (ref 36.0–46.0)
Hemoglobin: 11.1 g/dL — ABNORMAL LOW (ref 12.0–15.0)
MCH: 30.2 pg (ref 26.0–34.0)
MCHC: 33.5 g/dL (ref 30.0–36.0)
MCV: 90.2 fL (ref 78.0–100.0)
Platelets: 190 10*3/uL (ref 150–400)
RBC: 3.67 MIL/uL — ABNORMAL LOW (ref 3.87–5.11)
RDW: 14.4 % (ref 11.5–15.5)
WBC: 10.3 10*3/uL (ref 4.0–10.5)

## 2014-01-10 NOTE — Progress Notes (Signed)
Patient ID: Diane [name], female   DOB: 26-Jun-1976, 38 y.o.   MRN: [id number] PPD # 1 SVD  S:  Reports feeling feeling very well, anxious about having infant in NICU             Tolerating po/ No nausea or vomiting             Bleeding is spotting             Pain controlled with ibuprofen (OTC)             Up ad lib / ambulatory / voiding without difficulties    Newborn  Information for the patient's newborn:  Wynona NeatKrumroy, Girl Gladyce [[id number]][[id number]]  female  bottle feeding - pumped and hand-expressed milk   O:  A & O x 3, in no apparent distress              VS:  Filed Vitals:   01/09/14 2230 01/10/14 0545 01/10/14 0633 01/10/14 1306  BP: 109/58 129/46  129/79  Pulse: 75 69  83  Temp: 98.2 F (36.8 C)  97.6 F (36.4 C) 97.9 F (36.6 C)  TempSrc: Oral  Oral Oral  Resp: 18 18  20   Height:      Weight:      SpO2: 100% 99%  100%    LABS:  Recent Labs  01/08/14 2350 01/10/14 0500  WBC 10.9* 10.3  HGB 13.5 11.1*  HCT 38.7 33.1*  PLT 228 190    Blood type: AB/Positive/-- (10/10 0000)  Rubella: Immune (10/10 0000)     Lungs: Clear and unlabored  Heart: regular rate and rhythm / no murmurs  Abdomen: soft, non-tender, non-distended              Fundus: firm, non-tender, U-2  Perineum: 1st degree repair healing well, no edema  Lochia: minimal  Extremities: no edema, no calf pain or tenderness, no Homans    A/P: PPD # 1  38 y.o., [id number]   Active Problems:   Postpartum care following vaginal delivery (5/8)   Doing well - stable status  Routine post partum orders  Anticipate discharge tomorrow    Kenard Gowerolitta M Hermen Mario, MSN, CNM 01/10/2014, 3:04 PM

## 2014-01-10 NOTE — Progress Notes (Signed)
Patient ID: Diane [name], female   DOB: 1976/08/15, 38 y.o.   MRN: [id number] Patient not in room - in NICU nursing baby.  Will return later for rounds.  Kenard Gowerolitta M Amera Banos, MSN, CNM 01/10/2014 1:44 PM

## 2014-01-11 MED ORDER — IBUPROFEN 600 MG PO TABS: 600.0000 mg | ORAL_TABLET | Freq: Four times a day (QID) | ORAL | Status: DC

## 2014-01-11 NOTE — Progress Notes (Signed)
Patient ID: Diane [name], female   DOB: 01/20/1976, 38 y.o.   MRN: [id number] PPD # 2 SVD  S:  Reports feeling feeling very well             Tolerating po/ No nausea or vomiting             Bleeding is spotting             Pain controlled with ibuprofen (OTC)             Up ad lib / ambulatory / voiding without difficulties    Newborn  Information for the patient's newborn:  Wynona NeatKrumroy, Girl Jaleen [[id number]][[id number]]  female   bottle feeding - pumped and hand-expressed milk   O:  A & O x 3, in no apparent distress              VS:  Filed Vitals:   01/10/14 0545 01/10/14 [id number] 01/10/14 1306 01/10/14 2147  BP: 129/46  129/79 115/70  Pulse: 69  83 69  Temp:  97.6 F (36.4 C) 97.9 F (36.6 C) 98 F (36.7 C)  TempSrc:  Oral Oral Oral  Resp: 18  20 18   Height:      Weight:      SpO2: 99%  100% 99%    LABS:   Recent Labs  01/08/14 2350 01/10/14 0500  WBC 10.9* 10.3  HGB 13.5 11.1*  HCT 38.7 33.1*  PLT 228 190    Blood type: AB/Positive/-- (10/10 0000)  Rubella: Immune (10/10 0000)     Lungs: Clear and unlabored  Heart: regular rate and rhythm / no murmurs  Abdomen: soft, non-tender, non-distended              Fundus: firm, non-tender, U-3  Perineum: 1st degree repair healing well, no edema  Lochia: minimal  Extremities: no edema, no calf pain or tenderness, no Homans    A/P: PPD # 2  38 y.o., [id number]   Active Problems:   Postpartum care following vaginal delivery (5/8)   Doing well - stable status  Routine post partum orders  D/C home today - baby remains in NICU with possible d/c tomorrow morning    Kenard Gowerolitta M Tylerjames Hoglund, MSN, CNM 01/11/2014, 9:20 AM

## 2014-01-11 NOTE — Discharge Instructions (Signed)
Postpartum Depression and Baby Blues °The postpartum period begins right after the birth of a baby. During this time, there is often a great amount of joy and excitement. It is also a time of considerable changes in the life of the parent(s). Regardless of how many times a mother gives birth, each child brings new challenges and dynamics to the family. It is not unusual to have feelings of excitement accompanied by confusing shifts in moods, emotions, and thoughts. All mothers are at risk of developing postpartum depression or the "baby blues." These mood changes can occur right after giving birth, or they may occur many months after giving birth. The baby blues or postpartum depression can be mild or severe. Additionally, postpartum depression can resolve rather quickly, or it can be a long-term condition. °CAUSES °Elevated hormones and their rapid decline are thought to be a main cause of postpartum depression and the baby blues. There are a number of hormones that radically change during and after pregnancy. Estrogen and progesterone usually decrease immediately after delivering your baby. The level of thyroid hormone and various cortisol steroids also rapidly drop. Other factors that play a major role in these changes include major life events and genetics.  °RISK FACTORS °If you have any of the following risks for the baby blues or postpartum depression, know what symptoms to watch out for during the postpartum period. Risk factors that may increase the likelihood of getting the baby blues or postpartum depression include: °· Having a personal or family history of depression. °· Having depression while being pregnant. °· Having premenstrual or oral contraceptive-associated mood issues. °· Having exceptional life stress. °· Having marital conflict. °· Lacking a social support network. °· Having a baby with special needs. °· Having health problems such as diabetes. °SYMPTOMS °Baby blues symptoms include: °· Brief  fluctuations in mood, such as going from extreme happiness to sadness. °· Decreased concentration. °· Difficulty sleeping. °· Crying spells, tearfulness. °· Irritability. °· Anxiety. °Postpartum depression symptoms typically begin within the first month after giving birth. These symptoms include: °· Difficulty sleeping or excessive sleepiness. °· Marked weight loss. °· Agitation. °· Feelings of worthlessness. °· Lack of interest in activity or food. °Postpartum psychosis is a very concerning condition and can be dangerous. Fortunately, it is rare. Displaying any of the following symptoms is cause for immediate medical attention. Postpartum psychosis symptoms include: °· Hallucinations and delusions. °· Bizarre or disorganized behavior. °· Confusion or disorientation. °DIAGNOSIS  °A diagnosis is made by an evaluation of your symptoms. There are no medical or lab tests that lead to a diagnosis, but there are various questionnaires that a caregiver may use to identify those with the baby blues, postpartum depression, or psychosis. Often times, a screening tool called the Edinburgh Postnatal Depression Scale is used to diagnose depression in the postpartum period.  °TREATMENT °The baby blues usually goes away on its own in 1 to 2 weeks. Social support is often all that is needed. You should be encouraged to get adequate sleep and rest. Occasionally, you may be given medicines to help you sleep.  °Postpartum depression requires treatment as it can last several months or longer if it is not treated. Treatment may include individual or group therapy, medicine, or both to address any social, physiological, and psychological factors that may play a role in the depression. Regular exercise, a healthy diet, rest, and social support may also be strongly recommended.  °Postpartum psychosis is more serious and needs treatment right away. Hospitalization is   often needed. °HOME CARE INSTRUCTIONS °· Get as much rest as you can. Nap  when the baby sleeps. °· Exercise regularly. Some women find yoga and walking to be beneficial. °· Eat a balanced and nourishing diet. °· Do little things that you enjoy. Have a cup of tea, take a bubble bath, read your favorite magazine, or listen to your favorite music. °· Avoid alcohol. °· Ask for help with household chores, cooking, grocery shopping, or running errands as needed. Do not try to do everything. °· Talk to people close to you about how you are feeling. Get support from your partner, family members, friends, or other new moms. °· Try to stay positive in how you think. Think about the things you are grateful for. °· Do not spend a lot of time alone. °· Only take medicine as directed by your caregiver. °· Keep all your postpartum appointments. °· Let your caregiver know if you have any concerns. °SEEK MEDICAL CARE IF: °You are having a reaction or problems with your medicine. °SEEK IMMEDIATE MEDICAL CARE IF: °· You have suicidal feelings. °· You feel you may harm the baby or someone else. °Document Released: 05/25/2004 Document Revised: 11/13/2011 Document Reviewed: 06/02/2013 °[employer]® Patient Information ©2014 [employer]. °Breastfeeding and Mastitis °Mastitis is inflammation of the breast tissue. It can occur in women who are breastfeeding. This can make breastfeeding painful. Mastitis will sometimes go away on its own. Your health care provider will help determine if treatment is needed. °CAUSES °Mastitis is often associated with a blocked milk (lactiferous) duct. This can happen when too much milk builds up in the breast. Causes of excess milk in the breast can include: °· Poor latch-on. If your baby is not latched onto the breast properly, she or he may not empty your breast completely while breastfeeding. °· Allowing too much time to pass between feedings. °· Wearing a bra or other clothing that is too tight. This puts extra pressure on the lactiferous ducts so milk does not flow through them  as it should. °Mastitis can also be caused by a bacterial infection. Bacteria may enter the breast tissue through cuts or openings in the skin. In women who are breastfeeding, this may occur because of cracked or irritated skin. Cracks in the skin are often caused when your baby does not latch on properly to the breast. °SIGNS AND SYMPTOMS °· Swelling, redness, tenderness, and pain in an area of the breast. °· Swelling of the glands under the arm on the same side. °· Fever may or may not accompany mastitis. °If an infection is allowed to progress, a collection of pus (abscess) may develop. °DIAGNOSIS  °Your health care provider can usually diagnose mastitis based on your symptoms and a physical exam. Tests may be done to help confirm the diagnosis. These may include: °· Removal of pus from the breast by applying pressure to the area. This pus can be examined in the lab to determine which bacteria are present. If an abscess has developed, the fluid in the abscess can be removed with a needle. This can also be used to confirm the diagnosis and determine the bacteria present. In most cases, pus will not be present. °· Blood tests to determine if your body is fighting a bacterial infection. °· Mammogram or ultrasound tests to rule out other problems or diseases. °TREATMENT  °Mastitis that occurs with breastfeeding will sometimes go away on its own. Your health care provider may choose to wait 24 hours after first seeing   you to decide whether a prescription medicine is needed. If your symptoms are worse after 24 hours, your health care provider will likely prescribe an antibiotic to treat the mastitis. He or she will determine which bacteria are most likely causing the infection and will then select an appropriate antibiotic. This is sometimes changed based on the results of tests performed to identify the bacteria, or if there is no response to the antibiotic selected. Antibiotics are usually given by mouth. You may  also be given medicine for pain. °HOME CARE INSTRUCTIONS °· Only take over-the-counter or prescription medicines for pain, fever, or discomfort as directed by your health care provider. °· If your health care provider prescribed an antibiotic, take the medicine as directed. Make sure you finish it even if you start to feel better. °· Do not wear a tight or underwire bra. Wear a soft, supportive bra. °· Increase your fluid intake, especially if you have a fever. °· Continue to empty the breast. Your health care provider can tell you whether this milk is safe for your infant or needs to be thrown out. You may be told to stop nursing until your health care provider thinks it is safe for your baby. Use a breast pump if you are advised to stop nursing. °· Keep your nipples clean and dry. °· Empty the first breast completely before going to the other breast. If your baby is not emptying your breasts completely for some reason, use a breast pump to empty your breasts. °· If you go back to work, pump your breasts while at work to stay in time with your nursing schedule. °· Avoid allowing your breasts to become overly filled with milk (engorged). °SEEK MEDICAL CARE IF: °· You have pus-like discharge from the breast. °· Your symptoms do not improve with the treatment prescribed by your health care provider within 2 days. °SEEK IMMEDIATE MEDICAL CARE IF: °· Your pain and swelling are getting worse. °· You have pain that is not controlled with medicine. °· You have a red line extending from the breast toward your armpit. °· You have a fever or persistent symptoms for more than 2 3 days. °· You have a fever and your symptoms suddenly get worse. °MAKE SURE YOU:  °· Understand these instructions. °· Will watch your condition. °· Will get help right away if you are not doing well or get worse. °Document Released: 12/16/2004 Document Revised: 04/23/2013 Document Reviewed: 03/27/2013 °[employer]® Patient Information ©2014 [employer]. °Breast Pumping Tips °Pumping your breast milk is a good way to stimulate milk production and have a steady supply of breast milk for your infant. Pumping is most helpful during your infant's growth spurts, when involving dad or a family member, or when you are away. There are several types of pumps available. They can be purchased at a baby or maternity store. You can begin pumping soon after delivery, but some experts believe that you should wait about four weeks to give your infant a bottle. °In general, the more you breastfeed or pump, the more milk you will have for your infant. It is also important to take good care of yourself. This will reduce stress and help your body to create a healthy supply of milk. Your caregiver or lactation consultant can give you the information and support you need in your efforts to breastfeed your infant. °PUMPING BREAST MILK  °Follow the tips below for successful breast pumping. °Take care of yourself. °· Drink enough water or fluids   to keep urine clear or pale yellow. You may notice a thirsty feeling while breastfeeding. This is because your body needs more water to make breast milk. Keep a large water bottle handy. Make healthy drink choices such as unsweetened fruit juice, milk and water. Limit soda, coffee, and alcohol (wait 2 hours to feed or pump if you have an alcoholic drink.) °· Eat a healthy, well-balanced diet rich in fruits, vegetables, and whole grains. °· Exercise as recommended by your caregiver. °· Get plenty of sleep. Sleep when your infant sleeps. Ask friends and family for help if you need time to nap or rest. °· Do not smoke. Smoking can lower your milk supply and harm your infant. If you need help quitting, ask your caregiver for a program recommendation. °· Ask your caregiver about birth control options. Birth control pills may lower your milk supply. You may be advised to use condoms or other forms of birth control. °Relax and pump °Stimulating your  let-down reflex is the key to successful and effective pumping. This makes the milk in all parts of the breast flow more freely.  °· It is easier to pump breast milk (and breastfeed) while you are relaxed. Find techniques that work for you. Quiet private spaces, breast massage, soothing heat placed on the breast, music, and pictures or a tape recording of your infant may help you to relax and "let down" your milk. If you have difficulty with your let down, try smelling one of your infant's blankets or an item of clothing he or she has worn while you are pumping. °· When pumping, place the special suction cup (flange) directly over the nipple. It may be uncomfortable and cause nipple damage if it is not placed properly or is the wrong size. Applying a small amount of purified or modified lanolin to your nipple and the areola may help increase your comfort level. Also, you can change the speed and suction of many electric pumps to your comfort level. Your caregiver or lactation consultant can help you with this. °· If pumping continues to be painful, or you feel you are not getting very much milk when you pump, you may need a different type of pump. A lactation consultant can help you determine if this is the case. °· If you are with your infant, feed him or her on demand and try pumping after each feeding. This will boost your production, even if milk does not come out. You may not be able to pump much milk at first, but keep up the routine, and this will change. °· If you are working or away from your infant for several hours, try pumping for about 15 minutes every 2 to 3 hours. Pump both breasts at the same time if you can. °· If your infant has a formula feeding, make sure you pump your milk around the same time to maintain your supply. °· Begin pumping breast milk a few weeks before you return to work. This will help you develop techniques that work for you and will be able to store extra milk. °· Find a source  of breastfeeding information that works well for you. °TIPS FOR STORING BREAST MILK °· Store breast milk in a sealable sterile bag, jar, or container provided with your pumping supplies. °· Store milk in small amounts close to what your infant is drinking at each feeding. °· Cool pumped milk in a refrigerator or cooler. Pumped milk can last at the back of the refrigerator for   3 to 8 days. °· Place cooled milk at the back of the freezer for up to 3 months. °· Thaw the milk in its container or bag in warm water up to 24 hours in advance. Do not use a microwave to thaw or heat milk. Do not refreeze the milk after it has been thawed. °· Breast milk is safe to drink when left at room temperature (mid 70s or colder) for 4 to 8 hours. After that, throw it away. °· Milk fat can separate and look funny. The color can vary slightly from day to day. This is normal. Always shake the milk before using it to mix the fat with the more watery portion. °SEEK MEDICAL CARE IF:  °· You are having trouble pumping or feeding your infant. °· You are concerned that you are not making enough milk. °· You have nipple pain, soreness, or redness. °· You have other questions or concerns related to you or your infant. °Document Released: 02/08/2010 Document Revised: 11/13/2011 Document Reviewed: 02/08/2010 °[employer]® Patient Information ©2014 [employer]. °Breastfeeding °Deciding to breastfeed is one of the best choices you can make for you and your baby. A change in hormones during pregnancy causes your breast tissue to grow and increases the number and size of your milk ducts. These hormones also allow proteins, sugars, and fats from your blood supply to make breast milk in your milk-producing glands. Hormones prevent breast milk from being released before your baby is born as well as prompt milk flow after birth. Once breastfeeding has begun, thoughts of your baby, as well as his or her sucking or crying, can stimulate the release of milk  from your milk-producing glands.  °BENEFITS OF BREASTFEEDING °For Your Baby °· Your first milk (colostrum) helps your baby's digestive system function better.   °· There are antibodies in your milk that help your baby fight off infections.   °· Your baby has a lower incidence of asthma, allergies, and sudden infant death syndrome.   °· The nutrients in breast milk are better for your baby than infant formulas and are designed uniquely for your baby's needs.   °· Breast milk improves your baby's brain development.   °· Your baby is less likely to develop other conditions, such as childhood obesity, asthma, or type 2 diabetes mellitus.   °For You  °· Breastfeeding helps to create a very special bond between you and your baby.   °· Breastfeeding is convenient. Breast milk is always available at the correct temperature and costs nothing.   °· Breastfeeding helps to burn calories and helps you lose the weight gained during pregnancy.   °· Breastfeeding makes your uterus contract to its prepregnancy size faster and slows bleeding (lochia) after you give birth.   °· Breastfeeding helps to lower your risk of developing type 2 diabetes mellitus, osteoporosis, and breast or ovarian cancer later in life. °SIGNS THAT YOUR BABY IS HUNGRY °Early Signs of Hunger  °· Increased alertness or activity. °· Stretching. °· Movement of the head from side to side. °· Movement of the head and opening of the mouth when the corner of the mouth or cheek is stroked (rooting). °· Increased sucking sounds, smacking lips, cooing, sighing, or squeaking. °· Hand-to-mouth movements. °· Increased sucking of fingers or hands. °Late Signs of Hunger °· Fussing. °· Intermittent crying. °Extreme Signs of Hunger °Signs of extreme hunger will require calming and consoling before your baby will be able to breastfeed successfully. Do not wait for the following signs of extreme hunger to occur before you initiate breastfeeding:   °·   Restlessness. °· A loud,  strong cry. °·  Screaming. °BREASTFEEDING BASICS °Breastfeeding Initiation °· Find a comfortable place to sit or lie down, with your neck and back well supported. °· Place a pillow or rolled up blanket under your baby to bring him or her to the level of your breast (if you are seated). Nursing pillows are specially designed to help support your arms and your baby while you breastfeed. °· Make sure that your baby's abdomen is facing your abdomen.   °· Gently massage your breast. With your fingertips, massage from your chest wall toward your nipple in a circular motion. This encourages milk flow. You may need to continue this action during the feeding if your milk flows slowly. °· Support your breast with 4 fingers underneath and your thumb above your nipple. Make sure your fingers are well away from your nipple and your baby's mouth.   °· Stroke your baby's lips gently with your finger or nipple.   °· When your baby's mouth is open wide enough, quickly bring your baby to your breast, placing your entire nipple and as much of the colored area around your nipple (areola) as possible into your baby's mouth.   °· More areola should be visible above your baby's upper lip than below the lower lip.   °· Your baby's tongue should be between his or her lower gum and your breast.   °· Ensure that your baby's mouth is correctly positioned around your nipple (latched). Your baby's lips should create a seal on your breast and be turned out (everted). °· It is common for your baby to suck about 2 3 minutes in order to start the flow of breast milk. °Latching °Teaching your baby how to latch on to your breast properly is very important. An improper latch can cause nipple pain and decreased milk supply for you and poor weight gain in your baby. Also, if your baby is not latched onto your nipple properly, he or she may swallow some air during feeding. This can make your baby fussy. Burping your baby when you switch breasts during the  feeding can help to get rid of the air. However, teaching your baby to latch on properly is still the best way to prevent fussiness from swallowing air while breastfeeding. °Signs that your baby has successfully latched on to your nipple:    °· Silent tugging or silent sucking, without causing you pain.   °· Swallowing heard between every 3 4 sucks.   °·  Muscle movement above and in front of his or her ears while sucking.   °Signs that your baby has not successfully latched on to nipple:  °· Sucking sounds or smacking sounds from your baby while breastfeeding. °· Nipple pain. °If you think your baby has not latched on correctly, slip your finger into the corner of your baby's mouth to break the suction and place it between your baby's gums. Attempt breastfeeding initiation again. °Signs of Successful Breastfeeding °Signs from your baby:   °· A gradual decrease in the number of sucks or complete cessation of sucking.   °· Falling asleep.   °· Relaxation of his or her body.   °· Retention of a small amount of milk in his or her mouth.   °· Letting go of your breast by himself or herself. °Signs from you: °· Breasts that have increased in firmness, weight, and size 1 3 hours after feeding.   °· Breasts that are softer immediately after breastfeeding. °· Increased milk volume, as well as a change in milk consistency and color by the   5th day of breastfeeding.   °· Nipples that are not sore, cracked, or bleeding. °Signs That Your Baby is Getting Enough Milk °· Wetting at least 3 diapers in a 24-hour period. The urine should be clear and pale yellow by age 5 days. °· At least 3 stools in a 24-hour period by age 5 days. The stool should be soft and yellow. °· At least 3 stools in a 24-hour period by age 7 days. The stool should be seedy and yellow. °· No loss of weight greater than 10% of birth weight during the first 3 days of age. °· Average weight gain of 4 7 ounces (120 210 mL) per week after age 4 days. °· Consistent  daily weight gain by age 5 days, without weight loss after the age of 2 weeks. °After a feeding, your baby may spit up a small amount. This is common. °BREASTFEEDING FREQUENCY AND DURATION °Frequent feeding will help you make more milk and can prevent sore nipples and breast engorgement. Breastfeed when you feel the need to reduce the fullness of your breasts or when your baby shows signs of hunger. This is called "breastfeeding on demand." Avoid introducing a pacifier to your baby while you are working to establish breastfeeding (the first 4 6 weeks after your baby is born). After this time you may choose to use a pacifier. Research has shown that pacifier use during the first year of a baby's life decreases the risk of sudden infant death syndrome (SIDS). °Allow your baby to feed on each breast as long as he or she wants. Breastfeed until your baby is finished feeding. When your baby unlatches or falls asleep while feeding from the first breast, offer the second breast. Because newborns are often sleepy in the first few weeks of life, you may need to awaken your baby to get him or her to feed. °Breastfeeding times will vary from baby to baby. However, the following rules can serve as a guide to help you ensure that your baby is properly fed: °· Newborns (babies 4 weeks of age or younger) may breastfeed every 1 3 hours. °· Newborns should not go longer than 3 hours during the day or 5 hours during the night without breastfeeding. °· You should breastfeed your baby a minimum of 8 times in a 24-hour period until you begin to introduce solid foods to your baby at around 6 months of age. °BREAST MILK PUMPING °Pumping and storing breast milk allows you to ensure that your baby is exclusively fed your breast milk, even at times when you are unable to breastfeed. This is especially important if you are going back to work while you are still breastfeeding or when you are not able to be present during feedings. Your  lactation consultant can give you guidelines on how long it is safe to store breast milk.  °A breast pump is a machine that allows you to pump milk from your breast into a sterile bottle. The pumped breast milk can then be stored in a refrigerator or freezer. Some breast pumps are operated by hand, while others use electricity. Ask your lactation consultant which type will work best for you. Breast pumps can be purchased, but some hospitals and breastfeeding support groups lease breast pumps on a monthly basis. A lactation consultant can teach you how to hand express breast milk, if you prefer not to use a pump.  °CARING FOR YOUR BREASTS WHILE YOU BREASTFEED °Nipples can become dry, cracked, and sore while breastfeeding.   The following recommendations can help keep your breasts moisturized and healthy: °· Avoid using soap on your nipples.   °· Wear a supportive bra. Although not required, special nursing bras and tank tops are designed to allow access to your breasts for breastfeeding without taking off your entire bra or top. Avoid wearing underwire style bras or extremely tight bras. °· Air dry your nipples for 3 4 minutes after each feeding.   °· Use only cotton bra pads to absorb leaked breast milk. Leaking of breast milk between feedings is normal.   °· Use lanolin on your nipples after breastfeeding. Lanolin helps to maintain your skin's normal moisture barrier. If you use pure lanolin you do not need to wash it off before feeding your baby again. Pure lanolin is not toxic to your baby. You may also hand express a few drops of breast milk and gently massage that milk into your nipples and allow the milk to air dry. °In the first few weeks after giving birth, some women experience extremely full breasts (engorgement). Engorgement can make your breasts feel heavy, warm, and tender to the touch. Engorgement peaks within 3 5 days after you give birth. The following recommendations can help ease  engorgement: °· Completely empty your breasts while breastfeeding or pumping. You may want to start by applying warm, moist heat (in the shower or with warm water-soaked hand towels) just before feeding or pumping. This increases circulation and helps the milk flow. If your baby does not completely empty your breasts while breastfeeding, pump any extra milk after he or she is finished. °· Wear a snug bra (nursing or regular) or tank top for 1 2 days to signal your body to slightly decrease milk production. °· Apply ice packs to your breasts, unless this is too uncomfortable for you. °· Make sure that your baby is latched on and positioned properly while breastfeeding. °If engorgement persists after 48 hours of following these recommendations, contact your health care provider or a lactation consultant. °OVERALL HEALTH CARE RECOMMENDATIONS WHILE BREASTFEEDING °· Eat healthy foods. Alternate between meals and snacks, eating 3 of each per day. Because what you eat affects your breast milk, some of the foods may make your baby more irritable than usual. Avoid eating these foods if you are sure that they are negatively affecting your baby. °· Drink milk, fruit juice, and water to satisfy your thirst (about 10 glasses a day).   °· Rest often, relax, and continue to take your prenatal vitamins to prevent fatigue, stress, and anemia. °· Continue breast self-awareness checks. °· Avoid chewing and smoking tobacco. °· Avoid alcohol and drug use. °Some medicines that may be harmful to your baby can pass through breast milk. It is important to ask your health care provider before taking any medicine, including all over-the-counter and prescription medicine as well as vitamin and herbal supplements. °It is possible to become pregnant while breastfeeding. If birth control is desired, ask your health care provider about options that will be safe for your baby. °SEEK MEDICAL CARE IF:  °· You feel like you want to stop breastfeeding  or have become frustrated with breastfeeding. °· You have painful breasts or nipples. °· Your nipples are cracked or bleeding. °· Your breasts are red, tender, or warm. °· You have a swollen area on either breast. °· You have a fever or chills. °· You have nausea or vomiting. °· You have drainage other than breast milk from your nipples. °· Your breasts do not become full before feedings by   the 5th day after you give birth. °· You feel sad and depressed. °· Your baby is too sleepy to eat well. °· Your baby is having trouble sleeping.   °· Your baby is wetting less than 3 diapers in a 24-hour period. °· Your baby has less than 3 stools in a 24-hour period. °· Your baby's skin or the white part of his or her eyes becomes yellow.   °· Your baby is not gaining weight by 5 days of age. °SEEK IMMEDIATE MEDICAL CARE IF:  °· Your baby is overly tired (lethargic) and does not want to wake up and feed. °· Your baby develops an unexplained fever. °Document Released: 08/21/2005 Document Revised: 04/23/2013 Document Reviewed: 02/12/2013 °[employer]® Patient Information ©2014 [employer]. ° °

## 2014-01-11 NOTE — Progress Notes (Signed)
Discharge instructions reviewed with patient.  Patient states understanding of home care.  No home equipment needed.  Patient to room - in on NICU tonight.

## 2014-01-11 NOTE — Discharge Summary (Signed)
Obstetric Discharge Summary  Reason for Admission: Pt is a [id number] at 6883w1d  [name], with Diane [name], CNM as primary provider.  Medications on Admission: Prescriptions prior to admission  Medication Sig Dispense Refill  . CALCIUM PO Take 1 tablet by mouth daily.      . Prenatal Vit-Fe Fumarate-FA (PRENATAL MULTIVITAMIN) TABS Take 1 tablet by mouth daily.      . ranitidine (ZANTAC) 150 MG tablet Take 150 mg by mouth daily.      Marland Kitchen. acetaminophen (TYLENOL) 500 MG tablet Take 1,000 mg by mouth every 6 (six) hours as needed for moderate pain.      . benzonatate (TESSALON) 100 MG capsule Take 1 capsule (100 mg total) by mouth 3 (three) times daily as needed for cough.  21 capsule  0  . diphenhydrAMINE (BENADRYL) 25 MG tablet Take 50 mg by mouth at bedtime as needed for sleep.      Marland Kitchen. guaiFENesin (MUCINEX) 600 MG 12 hr tablet Take 1,200 mg by mouth 2 (two) times daily as needed for cough.      Marland Kitchen. HYDROcodone-homatropine (HYCODAN) 5-1.5 MG/5ML syrup Take 5 mLs by mouth every 6 (six) hours as needed for cough.  120 mL  0    Prenatal Labs: ABO, Rh: AB/Positive/-- (10/10 0000)  Antibody: Negative (10/10 0000) Rubella: Immune (10/10 0000)   RPR: NON REAC (05/07 2350)  HBsAg: Negative (10/10 0000)  HIV: Non-reactive (10/10 0000)  GTT : 127 mg/dL GBS: Positive (45/4010/10 98110000)   Prenatal Procedures: ultrasound Intrapartum Procedures: spontaneous vaginal delivery in water birth tub  Postpartum Procedures: none Complications-Operative and Postpartum: 1st degree perineal laceration  Labs: Hemoglobin  Date Value Ref Range Status  01/10/2014 11.1* 12.0 - 15.0 g/dL Final     DELTA CHECK NOTED     REPEATED TO VERIFY     HCT  Date Value Ref Range Status  01/10/2014 33.1* 36.0 - 46.0 % Final   Lab Results  Component Value Date   PLT 190 01/10/2014    Newborn Data: Live born female on 01/09/2014 Birth Weight: 9 lb 2.4 oz (4150 g) APGAR: 6,  4 Decreased color and tone to newborn ~ 9 minutes of age - HR low less than 100  Stimulation as transferred to warmer for assessment and resuscitation - Code Apgar called at 10 minutes  O2 initiated  NICU team arrival at 11 minutes of age and assumed care of newborn  Apgar 7-8 then 2 at 10 minutes  Apgar at 15 minutes - 9 per NICU team   Infant remains in NICU.   Discharge Information: Date: 01/11/2014 Discharge Diagnoses:  Pt is a [id number] at 3983w1d S/P Term Pregnancy-delivered on 01/09/2014  Condition: stable Activity: pelvic rest Diet: routine Medications:    Medication List         acetaminophen 500 MG tablet  Commonly known as:  TYLENOL  Take 1,000 mg by mouth every 6 (six) hours as needed for moderate pain.     benzonatate 100 MG capsule  Commonly known as:  TESSALON  Take 1 capsule (100 mg total) by mouth 3 (three) times daily as needed for cough.     CALCIUM PO  Take 1 tablet by mouth daily.     diphenhydrAMINE 25 MG tablet  Commonly known as:  BENADRYL  Take 50 mg by mouth at bedtime as needed for sleep.     guaiFENesin 600 MG 12 hr tablet  Commonly known as:  MUCINEX  Take 1,200 mg by mouth 2 (two) times daily as needed for cough.     HYDROcodone-homatropine 5-1.5 MG/5ML syrup  Commonly known as:  HYCODAN  Take 5 mLs by mouth every 6 (six) hours as needed for cough.     ibuprofen 600 MG tablet  Commonly known as:  ADVIL,MOTRIN  Take 1 tablet (600 mg total) by mouth every 6 (six) hours.     prenatal multivitamin Tabs tablet  Take 1 tablet by mouth daily.     ranitidine 150 MG tablet  Commonly known as:  ZANTAC  Take 150 mg by mouth daily.       Inst[facility]: [name] and reviewed Discharge to: home     Follow-up Information   Follow up with Diane MikeBAILEY, TANYA, CNM In 6 weeks. (for postpartum visit)    Specialty:  Obstetrics and Gynecology   Contact information:   [address] [place]  [id number] [phone]       Diane Gowerolitta M Zarie Kosiba, MSN, CNM 01/11/2014, 9:24 AM

## 2014-01-12 ENCOUNTER — Ambulatory Visit: Payer: Self-pay

## 2014-01-12 NOTE — Lactation Note (Signed)
This note was copied from the chart of Girl Louisa Secondracey Gowans. Lactation Consultation Note     Mom nad baby roomed in last night. He cluster fed, eating every hour. Mom has a sucking stripe on her left nipple, from shallow latch. Mom will call when Genivieve cues next, for me to come and assist with deeper latch.   Patient Name: Girl Louisa Secondracey Klumpp WUJWJ'XToday's Date: 01/12/2014 Reason for consult: Follow-up assessment;NICU baby   Maternal Data    Feeding Feeding Type: Breast Milk Length of feed: 20 min  LATCH Score/Interventions          Comfort (Breast/Nipple): Filling, red/small blisters or bruises, mild/mod discomfort (sucking stripes)  Problem noted: Mild/Moderate discomfort;Filling (ebm to nipples, massage knots of milk while breast feeding or puping, heat and cold alternating) Interventions (Filling): Massage;Frequent nursing Interventions (Mild/moderate discomfort): Hand massage;Pre-pump if needed        Lactation Tools Discussed/Used     Consult Status Consult Status: Follow-up Date: 01/12/14 Follow-up type: In-patient    Alfred LevinsChristine Anne Anela Bensman 01/12/2014, 10:11 AM

## 2014-01-12 NOTE — Lactation Note (Signed)
This note was copied from the chart of Diane Louisa Secondracey Landry. Lactation Consultation Note     Follow up consult with this mom and term baby, now 4779 hours old, and full term. Mom's milk has begun to transition in.she has a sucking stripe on left nipple, so I had mom try football hold. I noted a prominent lingual frenulum, about mid way back under her tongue, not appearing tight, but baby has limited extension of tongue, but can cover her bottom gum line.  Mom aware this may be why her nipple is sore, but with a deep latch, mom denies any pain, and her nipple after feeding did not appear pinched. I think mom not holding her breast was more the issue. Mom aware to make Dr. Patterson HammersmithBodie aware of baby's oral anotomy. Baby suckled for 15 minutes, with audible swallows. The knot of milk at 3 o'clock on mom's breast was better after feeding. Mom knows to call lactation for questions/concerns,[facility]  Patient Name: Diane [name] WUJWJ'XToday's Date: 01/12/2014 Reason for consult: Follow-up assessment;NICU baby   Maternal Data    Feeding Feeding Type: Breast Fed Length of feed: 15 min  LATCH Score/Interventions Latch: Grasps breast easily, tongue down, lips flanged, rhythmical sucking. Intervention(s): Assist with latch;Breast massage;Breast compression (knots of milk massaged out - gone after feeding)  Audible Swallowing: Spontaneous and intermittent  Type of Nipple: Everted at rest and after stimulation  Comfort (Breast/Nipple): Filling, red/small blisters or bruises, mild/mod discomfort  Problem noted: Filling;Mild/Moderate discomfort;Cracked, bleeding, blisters, bruises (sucking stripe on left nipple - baby with prominent lingual frenulum, not whilte, baby latched well , mom without discomfort with deep latch) Interventions (Filling): Massage;Frequent nursing Interventions  (Cracked/bleeding/bruising/blister): Expressed breast milk to nipple Interventions (Mild/moderate  discomfort): Hand massage;Pre-pump if needed  Hold (Positioning): Assistance needed to correctly position infant at breast and maintain latch. (mom encouraged to hold her breast during feeding, for baby to stay latached deeply, for the first 2-3 weeks of life) Intervention(s): Breastfeeding basics reviewed;Support Pillows;Position options;Skin to skin  LATCH Score: 8  Lactation Tools Discussed/Used     Consult Status Consult Status: Complete Date: 01/12/14 Follow-up type: Call as needed    Diane [name] 01/12/2014, 10:58 AM

## 2014-01-13 ENCOUNTER — Encounter (HOSPITAL_COMMUNITY): Payer: Self-pay | Admitting: *Deleted

## 2014-03-10 ENCOUNTER — Encounter (HOSPITAL_COMMUNITY): Payer: Self-pay | Admitting: Pharmacist

## 2014-03-16 ENCOUNTER — Other Ambulatory Visit: Payer: Self-pay | Admitting: Obstetrics and Gynecology

## 2014-03-17 ENCOUNTER — Encounter (HOSPITAL_COMMUNITY): Payer: Self-pay | Admitting: *Deleted

## 2014-03-22 NOTE — H&P (Signed)
[name]:  Bilger, Tressia              ACCOUNT NO.:  [id number]  MEDICAL RECORD NO.:  [id number]  LOCATION:  [facility]                         [facility]  PHYSICIAN:  Lenoard Adenichard J. Ashwin Tibbs, M.D.DATE OF BIRTH:  1975-09-25  DATE OF ADMISSION:  03/03/2014 DATE OF DISCHARGE:                             HISTORY & PHYSICAL   CHIEF COMPLAINT:  Desire for elective sterilization.  HISTORY OF PRESENT ILLNESS:  She is a 38 year old white female, G4, P2, for elective sterilization.  The patient declines ICD placement and declines Essure tubal sterilization system.  MEDICATIONS:  Include ibuprofen, birth control pills, and multivitamins.  ALLERGIES:  She has no known drug allergies.  FAMILY HISTORY:  Skin cancer, hypertension, heart disease, breast cancer, and lung cancer.  PAST MEDICAL HISTORY:  She has a history of 2 vaginal deliveries and 2 pregnancy loss.  PAST SURGICAL HISTORY:  Remarkable for D and C, thyroidectomy, appendectomy.  PHYSICAL EXAMINATION:  GENERAL:  She is a well-developed, well- nourished, white female, in no acute distress. HEENT:  Normal. NECK:  Supple.  Full range of motion. LUNGS:  Clear. HEART:  Regular rate and rhythm. ABDOMEN:  Soft, nontender. PELVIC:  Reveals a normal-sized uterus.  No adnexal masses. EXTREMITIES:  There are no cords. NEUROLOGIC:  Nonfocal. SKIN:  Intact.  IMPRESSION:  Desires elective sterilization.  PLAN:  Proceed with laparoscopic tubal ligation.  Risks of anesthesia, infection, bleeding, injury to surrounding organs, possible need for repair were discussed.  Delayed versus immediate complications to include bowel and bladder injury noted.  Failure and risk of tubal ligation were also discussed.  The patient acknowledges and wishes to proceed.     Lenoard Adenichard J. Rishik Tubby, M.D.     RJT/MEDQ  D:  03/22/2014  T:  03/22/2014  Job:  [id number]

## 2014-03-23 ENCOUNTER — Ambulatory Visit (HOSPITAL_COMMUNITY)
Admission: RE | Admit: 2014-03-23 | Discharge: 2014-03-23 | Disposition: A | Discharge status: Home / Self Care | Payer: BC Managed Care – PPO | Source: Ambulatory Visit | Attending: Obstetrics and Gynecology | Admitting: Obstetrics and Gynecology

## 2014-03-23 ENCOUNTER — Ambulatory Visit (HOSPITAL_COMMUNITY): Payer: BC Managed Care – PPO | Admitting: Certified Registered Nurse Anesthetist

## 2014-03-23 ENCOUNTER — Encounter (HOSPITAL_COMMUNITY)
Admission: RE | Disposition: A | Discharge status: Home / Self Care | Payer: Self-pay | Source: Ambulatory Visit | Attending: Obstetrics and Gynecology

## 2014-03-23 ENCOUNTER — Encounter (HOSPITAL_COMMUNITY): Payer: BC Managed Care – PPO | Admitting: Certified Registered Nurse Anesthetist

## 2014-03-23 DIAGNOSIS — Z302 Encounter for sterilization: Secondary | ICD-10-CM | POA: Insufficient documentation

## 2014-03-23 HISTORY — PX: LAPAROSCOPIC TUBAL LIGATION: SHX1937

## 2014-03-23 LAB — CBC
HEMATOCRIT: 39.3 % (ref 36.0–46.0)
Hemoglobin: 13.4 g/dL (ref 12.0–15.0)
MCH: 30.4 pg (ref 26.0–34.0)
MCHC: 34.1 g/dL (ref 30.0–36.0)
MCV: 89.1 fL (ref 78.0–100.0)
Platelets: 261 10*3/uL (ref 150–400)
RBC: 4.41 MIL/uL (ref 3.87–5.11)
RDW: 13.7 % (ref 11.5–15.5)
WBC: 6 10*3/uL (ref 4.0–10.5)

## 2014-03-23 LAB — HCG, SERUM, QUALITATIVE: Preg, Serum: NEGATIVE

## 2014-03-23 SURGERY — LIGATION, FALLOPIAN TUBE, LAPAROSCOPIC
Anesthesia: General | Laterality: Bilateral

## 2014-03-23 MED ORDER — ACETAMINOPHEN 160 MG/5ML PO SOLN
975.0000 mg | Freq: Once | ORAL | Status: AC
  Administered 2014-03-23: 975 mg via ORAL

## 2014-03-23 MED ORDER — GLYCOPYRROLATE 0.2 MG/ML IJ SOLN
INTRAMUSCULAR | Status: AC
  Filled 2014-03-23: qty 3

## 2014-03-23 MED ORDER — PROPOFOL 10 MG/ML IV BOLUS
INTRAVENOUS | Status: DC | PRN
  Administered 2014-03-23: 150 mg via INTRAVENOUS

## 2014-03-23 MED ORDER — LIDOCAINE HCL (CARDIAC) 20 MG/ML IV SOLN
INTRAVENOUS | Status: AC
  Filled 2014-03-23: qty 5

## 2014-03-23 MED ORDER — ROCURONIUM BROMIDE 100 MG/10ML IV SOLN
INTRAVENOUS | Status: DC | PRN
  Administered 2014-03-23: 30 mg via INTRAVENOUS

## 2014-03-23 MED ORDER — BUPIVACAINE HCL (PF) 0.25 % IJ SOLN
INTRAMUSCULAR | Status: AC
  Filled 2014-03-23: qty 30

## 2014-03-23 MED ORDER — GLYCOPYRROLATE 0.2 MG/ML IJ SOLN
INTRAMUSCULAR | Status: DC | PRN
  Administered 2014-03-23: 0.4 mg via INTRAVENOUS
  Administered 2014-03-23: 0.2 mg via INTRAVENOUS

## 2014-03-23 MED ORDER — FENTANYL CITRATE 0.05 MG/ML IJ SOLN
INTRAMUSCULAR | Status: DC | PRN
  Administered 2014-03-23 (×2): 50 ug via INTRAVENOUS
  Administered 2014-03-23: 100 ug via INTRAVENOUS

## 2014-03-23 MED ORDER — NEOSTIGMINE METHYLSULFATE 10 MG/10ML IV SOLN
INTRAVENOUS | Status: DC | PRN
  Administered 2014-03-23: 3 mg via INTRAVENOUS

## 2014-03-23 MED ORDER — DEXAMETHASONE SODIUM PHOSPHATE 10 MG/ML IJ SOLN
INTRAMUSCULAR | Status: AC
  Filled 2014-03-23: qty 1

## 2014-03-23 MED ORDER — LIDOCAINE HCL (CARDIAC) 20 MG/ML IV SOLN
INTRAVENOUS | Status: DC | PRN
  Administered 2014-03-23: 40 mg via INTRAVENOUS

## 2014-03-23 MED ORDER — DEXAMETHASONE SODIUM PHOSPHATE 10 MG/ML IJ SOLN
INTRAMUSCULAR | Status: DC | PRN
  Administered 2014-03-23: 10 mg via INTRAVENOUS

## 2014-03-23 MED ORDER — KETOROLAC TROMETHAMINE 30 MG/ML IJ SOLN
INTRAMUSCULAR | Status: DC | PRN
  Administered 2014-03-23: 30 mg via INTRAVENOUS

## 2014-03-23 MED ORDER — ACETAMINOPHEN 160 MG/5ML PO SOLN
ORAL | Status: AC
  Administered 2014-03-23: 975 mg via ORAL
  Filled 2014-03-23: qty 40.6

## 2014-03-23 MED ORDER — METOCLOPRAMIDE HCL 5 MG/ML IJ SOLN: 10.0000 mg | Freq: Once | INTRAMUSCULAR | Status: DC | PRN

## 2014-03-23 MED ORDER — HYDROMORPHONE HCL PF 1 MG/ML IJ SOLN
INTRAMUSCULAR | Status: AC
  Filled 2014-03-23: qty 1

## 2014-03-23 MED ORDER — MIDAZOLAM HCL 2 MG/2ML IJ SOLN
INTRAMUSCULAR | Status: AC
  Filled 2014-03-23: qty 2

## 2014-03-23 MED ORDER — BUPIVACAINE HCL (PF) 0.25 % IJ SOLN
INTRAMUSCULAR | Status: DC | PRN
  Administered 2014-03-23: 23 mL

## 2014-03-23 MED ORDER — PROPOFOL 10 MG/ML IV EMUL
INTRAVENOUS | Status: AC
  Filled 2014-03-23: qty 20

## 2014-03-23 MED ORDER — KETOROLAC TROMETHAMINE 30 MG/ML IJ SOLN: 15.0000 mg | Freq: Once | INTRAMUSCULAR | Status: DC | PRN

## 2014-03-23 MED ORDER — KETOROLAC TROMETHAMINE 30 MG/ML IJ SOLN
INTRAMUSCULAR | Status: AC
  Filled 2014-03-23: qty 1

## 2014-03-23 MED ORDER — SODIUM CHLORIDE 0.9 % IJ SOLN
INTRAMUSCULAR | Status: DC | PRN
  Administered 2014-03-23: 10 mL

## 2014-03-23 MED ORDER — SCOPOLAMINE 1 MG/3DAYS TD PT72
MEDICATED_PATCH | TRANSDERMAL | Status: AC
  Administered 2014-03-23: 1.5 mg
  Filled 2014-03-23: qty 1

## 2014-03-23 MED ORDER — FENTANYL CITRATE 0.05 MG/ML IJ SOLN
INTRAMUSCULAR | Status: AC
  Filled 2014-03-23: qty 5

## 2014-03-23 MED ORDER — OXYCODONE-ACETAMINOPHEN 5-325 MG PO TABS: 1.0000 | ORAL_TABLET | ORAL | Status: DC | PRN

## 2014-03-23 MED ORDER — FENTANYL CITRATE 0.05 MG/ML IJ SOLN: 25.0000 ug | INTRAMUSCULAR | Status: DC | PRN

## 2014-03-23 MED ORDER — SODIUM CHLORIDE 0.9 % IJ SOLN
INTRAMUSCULAR | Status: AC
  Filled 2014-03-23: qty 10

## 2014-03-23 MED ORDER — NEOSTIGMINE METHYLSULFATE 10 MG/10ML IV SOLN
INTRAVENOUS | Status: AC
  Filled 2014-03-23: qty 1

## 2014-03-23 MED ORDER — CEFAZOLIN SODIUM-DEXTROSE 2-3 GM-% IV SOLR
INTRAVENOUS | Status: AC
  Filled 2014-03-23: qty 50

## 2014-03-23 MED ORDER — CEFAZOLIN SODIUM-DEXTROSE 2-3 GM-% IV SOLR
2.0000 g | INTRAVENOUS | Status: AC
  Administered 2014-03-23: 2 g via INTRAVENOUS

## 2014-03-23 MED ORDER — ONDANSETRON HCL 4 MG/2ML IJ SOLN
INTRAMUSCULAR | Status: DC | PRN
  Administered 2014-03-23: 4 mg via INTRAVENOUS

## 2014-03-23 MED ORDER — LACTATED RINGERS IV SOLN
INTRAVENOUS | Status: DC
  Administered 2014-03-23 (×2): via INTRAVENOUS

## 2014-03-23 MED ORDER — ONDANSETRON HCL 4 MG/2ML IJ SOLN
INTRAMUSCULAR | Status: AC
  Filled 2014-03-23: qty 2

## 2014-03-23 MED ORDER — ROCURONIUM BROMIDE 100 MG/10ML IV SOLN
INTRAVENOUS | Status: AC
  Filled 2014-03-23: qty 1

## 2014-03-23 MED ORDER — MEPERIDINE HCL 25 MG/ML IJ SOLN: 6.2500 mg | INTRAMUSCULAR | Status: DC | PRN

## 2014-03-23 SURGICAL SUPPLY — 18 items
CATH ROBINSON RED A/P 16FR (CATHETERS) ×3 IMPLANT
CLOTH BEACON ORANGE TIMEOUT ST (SAFETY) ×3 IMPLANT
DERMABOND ADVANCED (GAUZE/BANDAGES/DRESSINGS) ×2
DERMABOND ADVANCED .7 DNX12 (GAUZE/BANDAGES/DRESSINGS) ×1 IMPLANT
DRSG COVADERM PLUS 2X2 (GAUZE/BANDAGES/DRESSINGS) ×6 IMPLANT
DRSG OPSITE POSTOP 3X4 (GAUZE/BANDAGES/DRESSINGS) ×3 IMPLANT
GLOVE BIO SURGEON STRL SZ7.5 (GLOVE) ×3 IMPLANT
GOWN STRL REUS W/TWL LRG LVL3 (GOWN DISPOSABLE) ×6 IMPLANT
HEMOSTAT SURGICEL 2X14 (HEMOSTASIS) ×3 IMPLANT
NEEDLE INSUFFLATION 120MM (ENDOMECHANICALS) ×3 IMPLANT
PACK LAPAROSCOPY BASIN (CUSTOM PROCEDURE TRAY) ×3 IMPLANT
SOLUTION ELECTROLUBE (MISCELLANEOUS) ×3 IMPLANT
SUT VICRYL 0 UR6 27IN ABS (SUTURE) ×3 IMPLANT
SUT VICRYL 4-0 PS2 18IN ABS (SUTURE) IMPLANT
TOWEL OR 17X24 6PK STRL BLUE (TOWEL DISPOSABLE) ×6 IMPLANT
TROCAR XCEL DIL TIP R 11M (ENDOMECHANICALS) ×3 IMPLANT
WARMER LAPAROSCOPE (MISCELLANEOUS) ×3 IMPLANT
WATER STERILE IRR 1000ML POUR (IV SOLUTION) ×3 IMPLANT

## 2014-03-23 NOTE — Anesthesia Preprocedure Evaluation (Addendum)
Anesthesia Evaluation  Patient identified by MRN, date of birth, ID band Patient awake    Reviewed: Allergy & Precautions, H&P , NPO status , Patient's Chart, lab work & pertinent test results, reviewed documented beta blocker date and time   History of Anesthesia Complications (+) PONV  Airway Mallampati: I TM Distance: >3 FB Neck ROM: full    Dental  (+) Teeth Intact   Pulmonary neg pulmonary ROS,  breath sounds clear to auscultation  Pulmonary exam normal       Cardiovascular negative cardio ROS  Rhythm:regular Rate:Normal     Neuro/Psych negative neurological ROS  negative psych ROS   GI/Hepatic negative GI ROS, Neg liver ROS,   Endo/Other  negative endocrine ROS  Renal/GU negative Renal ROS     Musculoskeletal   Abdominal   Peds  Hematology H/o blood transfusion postpartum in 2012   Anesthesia Other Findings   Reproductive/Obstetrics (+) Breast feeding                           Anesthesia Physical Anesthesia Plan  ASA: I  Anesthesia Plan: General ETT   Post-op Pain Management:    Induction:   Airway Management Planned:   Additional Equipment:   Intra-op Plan:   Post-operative Plan:   Informed Consent: I have reviewed the patients History and Physical, chart, labs and discussed the procedure including the risks, benefits and alternatives for the proposed anesthesia with the patient or authorized representative who has indicated his/her understanding and acceptance.   Dental Advisory Given  Plan Discussed with: CRNA and Surgeon  Anesthesia Plan Comments:         Anesthesia Quick Evaluation

## 2014-03-23 NOTE — Progress Notes (Signed)
Patient ID: Diane [name], female   DOB: [birth year], 38 y.o.   MRN: [id number] Patient seen and examined. Consent witnessed and signed. No changes noted. Update completed. CBC    Component Value Date/Time   WBC 6.0 03/23/2014 0826   RBC 4.41 03/23/2014 0826   HGB 13.4 03/23/2014 0826   HCT 39.3 03/23/2014 0826   PLT 261 03/23/2014 0826   MCV 89.1 03/23/2014 0826   MCH 30.4 03/23/2014 0826   MCHC 34.1 03/23/2014 0826   RDW 13.7 03/23/2014 0826   LYMPHSABS 1.9 11/03/2013 0339   MONOABS 1.1* 11/03/2013 0339   EOSABS 0.2 11/03/2013 0339   BASOSABS 0.0 11/03/2013 0339

## 2014-03-23 NOTE — Op Note (Signed)
03/23/2014  10:57 AM  PATIENT:  Diane [name]  38 y.o. female  PRE-OPERATIVE DIAGNOSIS:  Elective Sterilization  POST-OPERATIVE DIAGNOSIS:  Elective Sterilization  PROCEDURE:  Procedure(s): LAPAROSCOPIC TUBAL LIGATION  SURGEON:  Surgeon(s): Lenoard Adenichard J Zienna Ahlin, MD  ASSISTANTS: none   ANESTHESIA:   local and general  ESTIMATED BLOOD LOSS:minimal  DRAINS: none   LOCAL MEDICATIONS USED:  MARCAINE    and Amount: 15 ml  SPECIMEN:  No Specimen  DISPOSITION OF SPECIMEN:  N/A  COUNTS:  YES  DICTATION #: [id number]  PLAN OF CARE: dc home  PATIENT DISPOSITION:  PACU - hemodynamically stable.

## 2014-03-23 NOTE — Transfer of Care (Signed)
Immediate Anesthesia Transfer of Care Note  Patient: Diane [name]  Procedure(s) Performed: Procedure(s): LAPAROSCOPIC TUBAL LIGATION (Bilateral)  Patient Location: PACU  Anesthesia Type:General  Level of Consciousness: awake, alert  and oriented  Airway & Oxygen Therapy: Patient Spontanous Breathing and Patient connected to nasal cannula oxygen  Post-op Assessment: Report given to PACU RN, Post -op Vital signs reviewed and stable and Patient moving all extremities X 4  Post vital signs: Reviewed and stable  Complications: No apparent anesthesia complications

## 2014-03-23 NOTE — Anesthesia Postprocedure Evaluation (Signed)
  Anesthesia Post-op Note  Anesthesia Post Note  Patient: Diane [name]  Procedure(s) Performed: Procedure(s) (LRB): LAPAROSCOPIC TUBAL LIGATION (Bilateral)  Anesthesia type: General  Patient location: PACU  Post pain: Pain level controlled  Post assessment: Post-op Vital signs reviewed  Last Vitals:  Filed Vitals:   03/23/14 1245  BP: 107/54  Pulse: 46  Temp: 36.6 C  Resp: 18    Post vital signs: Reviewed  Level of consciousness: sedated  Complications: No apparent anesthesia complications

## 2014-03-23 NOTE — Discharge Instructions (Signed)

## 2014-03-24 ENCOUNTER — Encounter (HOSPITAL_COMMUNITY): Payer: Self-pay | Admitting: Obstetrics and Gynecology

## 2014-03-24 NOTE — Op Note (Signed)
NAMCydney Ok:  [name], Diane [name]              ACCOUNT NO.:  [id number]  MEDICAL RECORD NO.:  [id number]  LOCATION:  [facility]                          [facility]  PHYSICIAN:  Lenoard Adenichard J. Tharon Bomar, M.D.DATE OF BIRTH:  14-Sep-1975  DATE OF PROCEDURE: DATE OF DISCHARGE:  03/23/2014                              OPERATIVE REPORT   PREOPERATIVE DIAGNOSIS:  Desire for elective sterilization.  POSTOPERATIVE DIAGNOSIS:  Desire for elective sterilization.  PROCEDURE:  Laparoscopic tubal ligation.  SURGEON:  Lenoard Adenichard J. Yuliet Needs, M.D.  ASSISTANT:  None.  ANESTHESIA:  General.  ESTIMATED BLOOD LOSS:  Less than 20 mL.  COMPLICATIONS:  Small fundal perforation with the Hulka manipulator.  DISPOSITION:  The patient to recovery in good condition.  COUNTS:  All counts correct.  BRIEF OPERATIVE NOTE:  After being apprised of risks of anesthesia, infection, bleeding, injury to surrounding organs, need for repair, delayed versus immediate complications to include bowel and bladder injury with possible need for repair, the patient was brought to the operating room where she was administered a general anesthetic without complications.  Prepped and draped in usual sterile fashion. Catheterized until the bladder was empty.  The Hulka tenaculum was placed per vagina without difficulty.  After exam under anesthesia revealed a normal-sized anteflexed uterus, at this time, attention was turned to the abdominal portion of the procedure whereby an infraumbilical incision was made with a scalpel.  Veress needle was placed with opening pressure of -1, 4 liters of CO2 insufflated without difficulty.  Trocar was placed atraumatically.  Pictures were taken. Normal liver gallbladder bed, normal appendiceal area; however, the appendix was known to be surgically absent.  At this time upon establishing deep Trendelenburg position, there was a small midline fundal perforation that was made by the blunt tip of the  Hulka retractor.  This was noted at that time to be bleeding minimally at this time.  Therefore, the right tube was traced out to the fimbriated end and ampullary-isthmic portion of the tube was identified.  Bipolar cautery and three contiguous areas was performed without difficulty. The same procedure was done on the left tube and then both tubes were divided using the hook scissors.  Tubal lumens were visualized.  At this time having achieved secure tubal ligation bilaterally, the Hulka tenaculum was retracted and the small fundal perforation was inspected, cauterized using bipolar cautery.  A small piece of Surgicel was placed over this small fundal perforation.  There was no active bleeding noted. At this time, CO2 was released, and further inspection and observation revealed no evidence of active bleeding.  Some dilute Marcaine was dripped on both tubes.  All instruments were removed under direct visualization.  CO2 was released completely and trocars were removed.  Incision was closed using 0 Vicryl and 4-0 Vicryl.  The patient tolerated the procedure well, was awakened, and transferred to recovery in good condition.     Lenoard Adenichard J. Bartolo Montanye, M.D.     RJT/MEDQ  D:  03/23/2014  T:  03/24/2014  Job:  [id number]

## 2014-03-25 NOTE — Addendum Note (Signed)
Addendum created 03/25/14 1317 by Dana AllanAmy Consepcion Utt, MD   Modules edited: Anesthesia Attestations

## 2014-07-06 ENCOUNTER — Encounter (HOSPITAL_COMMUNITY): Payer: Self-pay | Admitting: Obstetrics and Gynecology

## 2015-10-04 ENCOUNTER — Other Ambulatory Visit: Payer: Self-pay | Admitting: Internal Medicine

## 2015-10-04 DIAGNOSIS — K429 Umbilical hernia without obstruction or gangrene: Secondary | ICD-10-CM

## 2015-10-11 ENCOUNTER — Ambulatory Visit
Admission: RE | Admit: 2015-10-11 | Discharge: 2015-10-11 | Disposition: A | Discharge status: Home / Self Care | Payer: BLUE CROSS/BLUE SHIELD | Source: Ambulatory Visit | Attending: Internal Medicine | Admitting: Internal Medicine

## 2015-10-11 DIAGNOSIS — K429 Umbilical hernia without obstruction or gangrene: Secondary | ICD-10-CM

## 2015-10-14 ENCOUNTER — Other Ambulatory Visit: Payer: Self-pay | Admitting: Internal Medicine

## 2015-10-14 DIAGNOSIS — R109 Unspecified abdominal pain: Secondary | ICD-10-CM

## 2015-10-18 ENCOUNTER — Ambulatory Visit
Admission: RE | Admit: 2015-10-18 | Discharge: 2015-10-18 | Disposition: A | Discharge status: Home / Self Care | Payer: BLUE CROSS/BLUE SHIELD | Source: Ambulatory Visit | Attending: Internal Medicine | Admitting: Internal Medicine

## 2015-10-18 DIAGNOSIS — R109 Unspecified abdominal pain: Secondary | ICD-10-CM

## 2016-09-04 IMAGING — CT CT ABD-PELV W/O CM
3 of 4 series · 12 of 36 positions shown, 19 images · non-contrast
Comparison: Limited abdominal ultrasound 10/11/2015

CLINICAL DATA: Abdominal pain. Possible hernia, with area of
concern more noticeable now following intentional 40 pound weight
loss.

EXAM:
CT ABDOMEN AND PELVIS WITHOUT CONTRAST
TECHNIQUE: Multidetector CT imaging of the abdomen and pelvis was performed
following the standard protocol without IV contrast.

[Series 3: abd/pelvis w/o · axial · non-contrast · 0.68mm/px · z∈[-354,-29]mm · 9 of 83 slices shown, 15 images]
[im 9/83  soft-tissue]
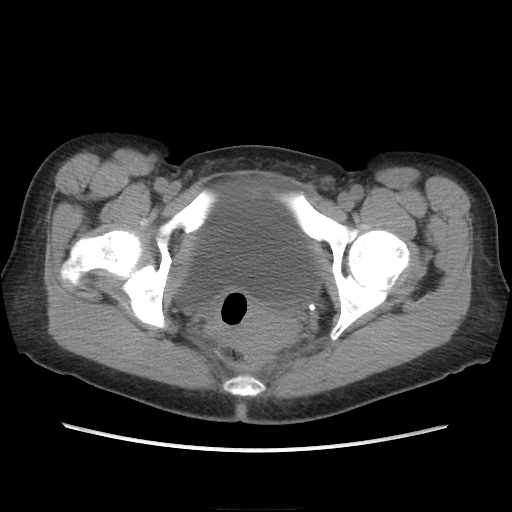
[im 9/83  bone]
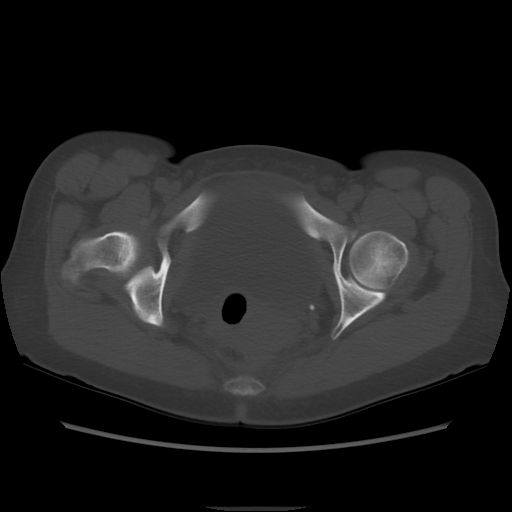
[im 17/83  soft-tissue]
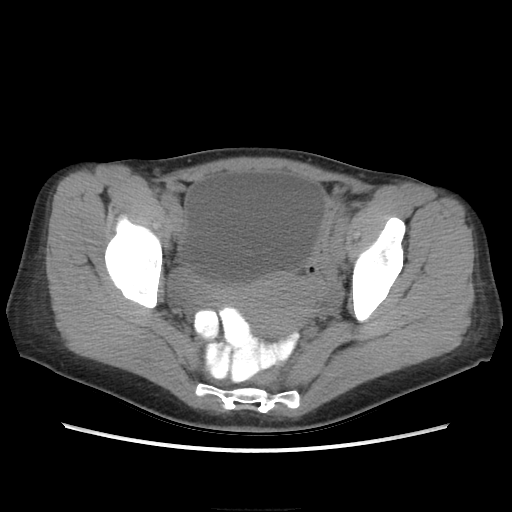
[im 25/83  soft-tissue]
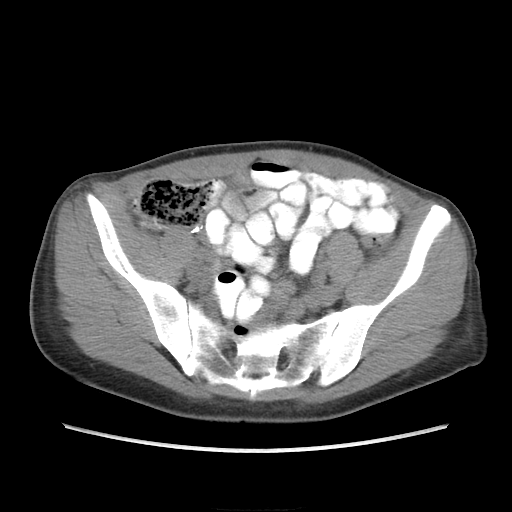
[im 33/83  soft-tissue]
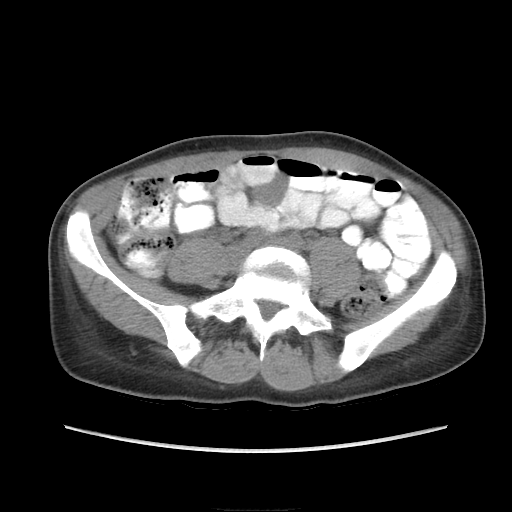
[im 42/83  soft-tissue]
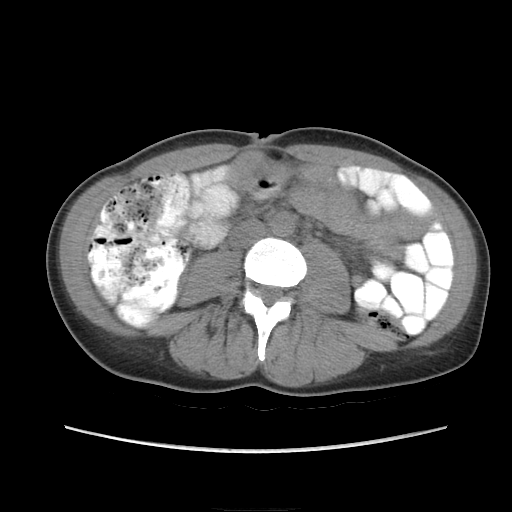
[im 50/83  soft-tissue]
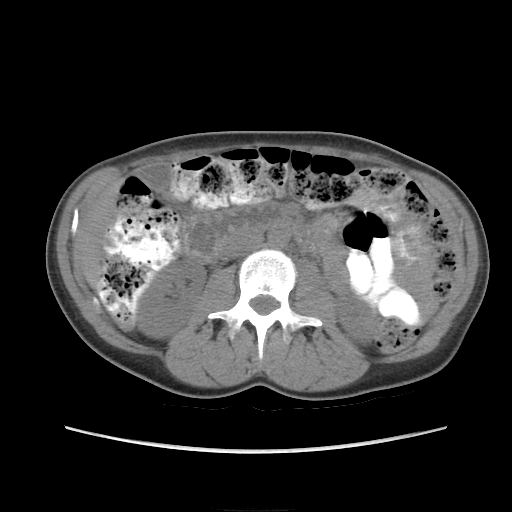
[im 50/83  lung]
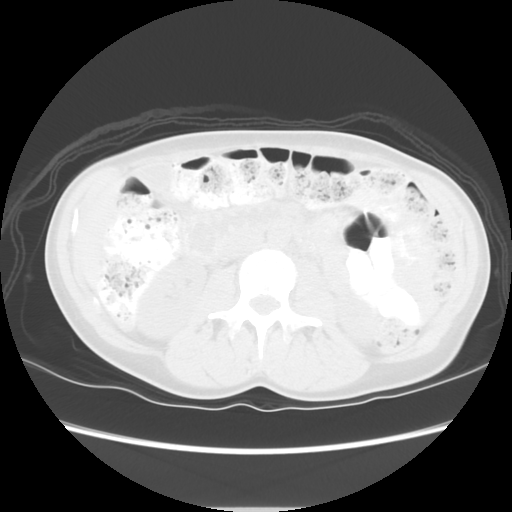
[im 58/83  soft-tissue]
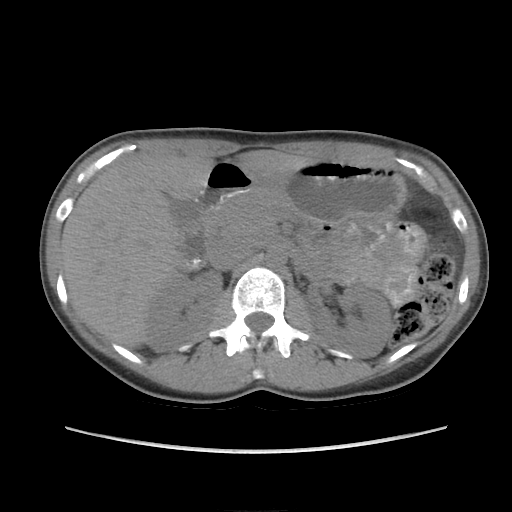
[im 58/83  lung]
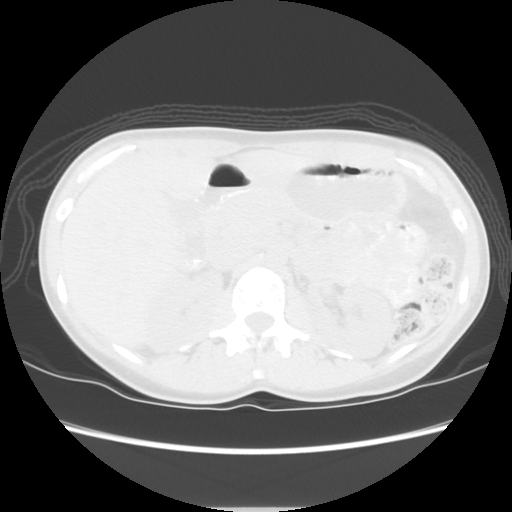
[im 66/83  soft-tissue]
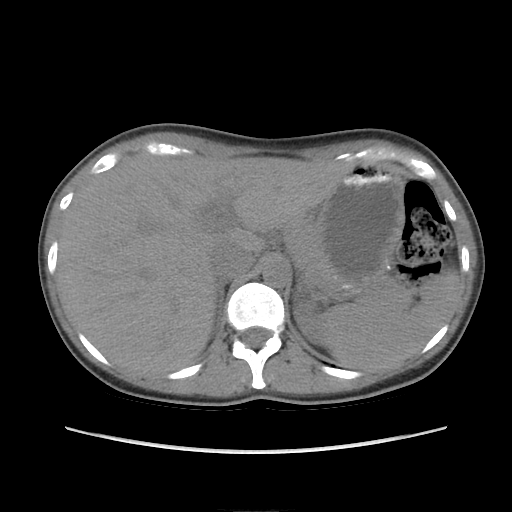
[im 66/83  lung]
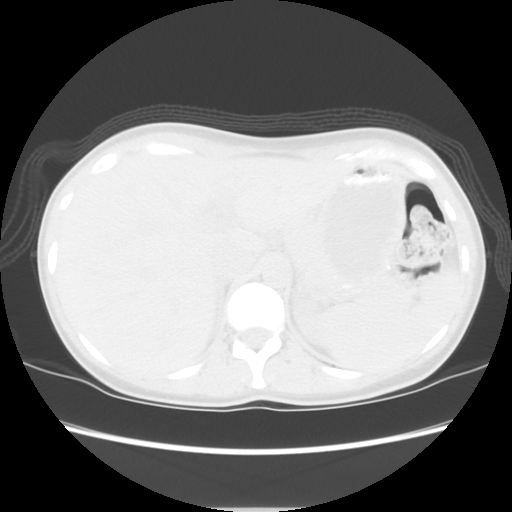
[im 74/83  soft-tissue]
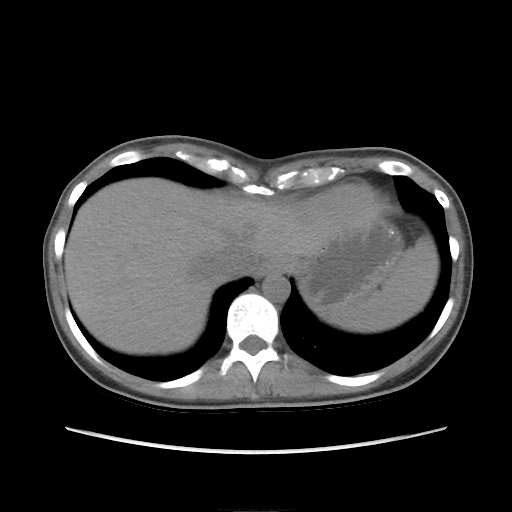
[im 74/83  lung]
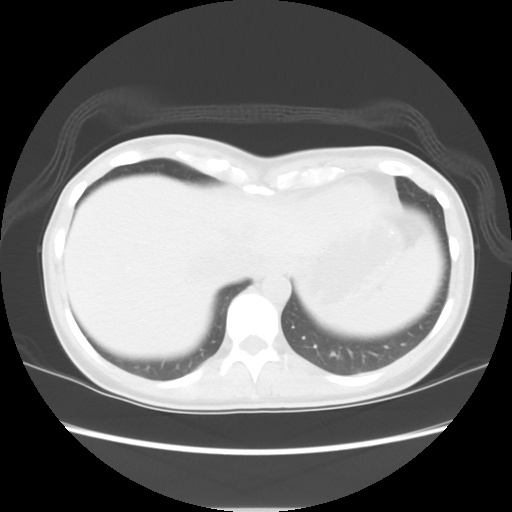
[im 74/83  bone]
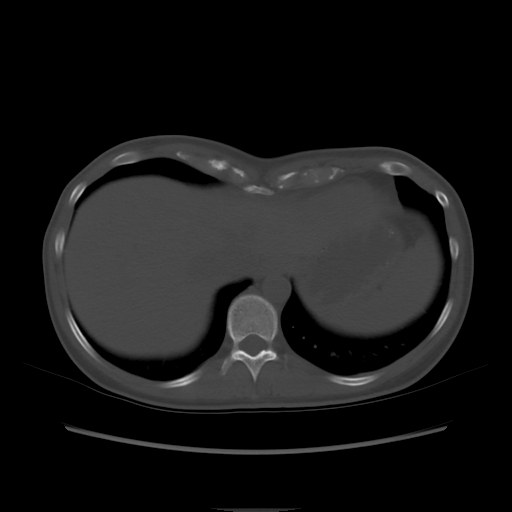

[Series 601: coronal body · coronal · 0.88mm/px · 1 of 100 slices shown, 2 images]
[im 34/100  soft-tissue]
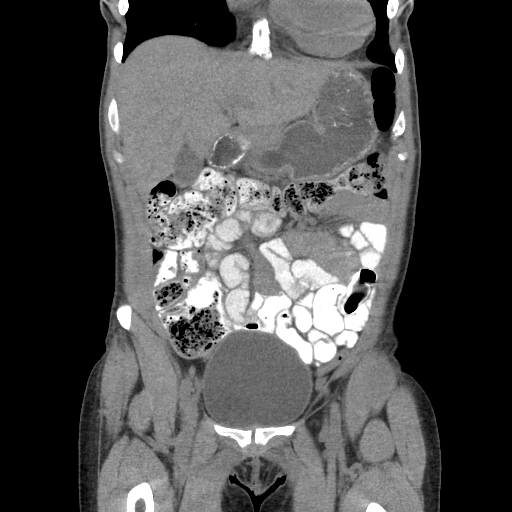
[im 34/100  bone]
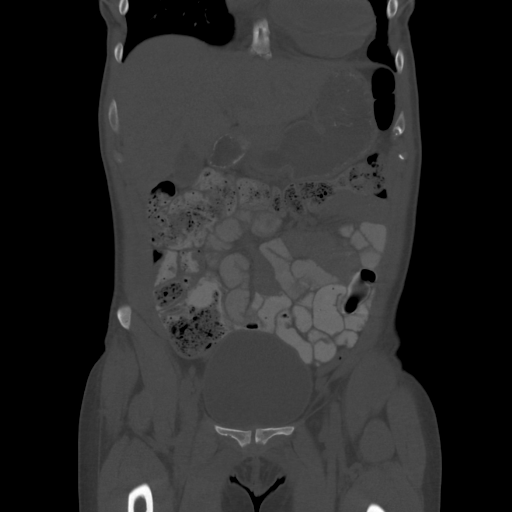

[Series 602: sagittal body · sagittal · 0.88mm/px · 2 of 140 slices shown]
[im 17/140  soft-tissue]
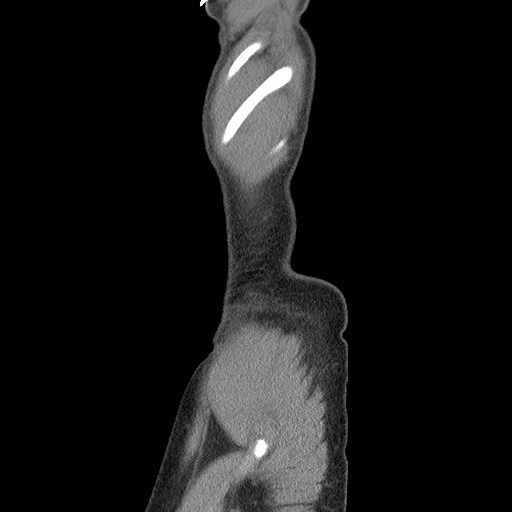
[im 33/140  soft-tissue]
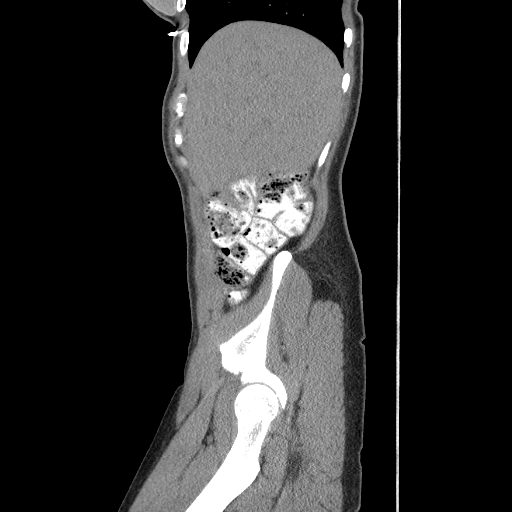

[12 of 36 positions shown; findings below may reference images not displayed]

FINDINGS: The visualized lung bases are clear aside from trace subpleural
opacity in the left lower lobe likely reflecting atelectasis.

The liver, gallbladder, spleen, adrenal glands, right kidney, and
pancreas have an unremarkable unenhanced appearance. There is a
punctate, 2 mm nonobstructing calculus in the lower pole of the left
kidney.

Oral contrast is present in nondilated loops of small and large
bowel to the level of the proximal transverse colon. There is no
evidence of bowel obstruction. The appendix is surgically absent.
There is a 1.5 cm fat-containing umbilical hernia corresponding to
the abnormality on ultrasound and in the area of clinical concern.
No bowel containing hernia is seen.

The bladder is unremarkable. Uterus and ovaries are grossly
unremarkable. No free fluid or enlarged lymph nodes are identified.
The osseous structures are unremarkable.
IMPRESSION: 1. Small fat-containing umbilical hernia.
2. Nonobstructing left renal calculus.

## 2019-06-02 ENCOUNTER — Other Ambulatory Visit: Payer: Self-pay | Admitting: *Deleted

## 2019-06-02 DIAGNOSIS — Z20822 Contact with and (suspected) exposure to covid-19: Secondary | ICD-10-CM

## 2019-06-05 LAB — NOVEL CORONAVIRUS, NAA: SARS-CoV-2, NAA: NOT DETECTED

## 2019-07-22 ENCOUNTER — Other Ambulatory Visit: Payer: Self-pay

## 2019-07-22 DIAGNOSIS — Z20822 Contact with and (suspected) exposure to covid-19: Secondary | ICD-10-CM

## 2019-07-24 LAB — NOVEL CORONAVIRUS, NAA: SARS-CoV-2, NAA: NOT DETECTED

## 2019-10-01 ENCOUNTER — Ambulatory Visit: Payer: BLUE CROSS/BLUE SHIELD | Attending: Internal Medicine

## 2019-10-01 DIAGNOSIS — Z20822 Contact with and (suspected) exposure to covid-19: Secondary | ICD-10-CM

## 2019-10-02 LAB — NOVEL CORONAVIRUS, NAA: SARS-CoV-2, NAA: NOT DETECTED

## 2020-04-21 ENCOUNTER — Other Ambulatory Visit: Payer: Self-pay | Admitting: Obstetrics & Gynecology

## 2020-06-23 ENCOUNTER — Encounter (HOSPITAL_BASED_OUTPATIENT_CLINIC_OR_DEPARTMENT_OTHER): Payer: Self-pay | Admitting: Obstetrics and Gynecology

## 2020-06-23 ENCOUNTER — Other Ambulatory Visit: Payer: Self-pay

## 2020-06-26 ENCOUNTER — Other Ambulatory Visit (HOSPITAL_COMMUNITY)
Admission: RE | Admit: 2020-06-26 | Discharge: 2020-06-26 | Disposition: A | Discharge status: Home / Self Care | Payer: Managed Care, Other (non HMO) | Source: Ambulatory Visit | Attending: Obstetrics and Gynecology | Admitting: Obstetrics and Gynecology

## 2020-06-26 DIAGNOSIS — Z20822 Contact with and (suspected) exposure to covid-19: Secondary | ICD-10-CM | POA: Diagnosis not present

## 2020-06-26 DIAGNOSIS — Z01812 Encounter for preprocedural laboratory examination: Secondary | ICD-10-CM | POA: Insufficient documentation

## 2020-06-26 LAB — SARS CORONAVIRUS 2 (TAT 6-24 HRS): SARS Coronavirus 2: NEGATIVE

## 2020-06-27 ENCOUNTER — Other Ambulatory Visit: Payer: Self-pay | Admitting: Obstetrics and Gynecology

## 2020-06-27 NOTE — H&P (Signed)
Subjective:    Chief Complaint(s):      preop/abnormal uterine bleeding.       HPI:          General          44 y/o presents for pre op visit            She is scheduled for hysteroscopy D&C with hydrothermal ablation on 06/30/2020 at 12:30 pm for management of AUB.             Ongoing issue for over a year- previously seen at [facility]- work up including labs and [place] that showed no abnormalities. Advised pill, but she wishes to understand more about what's going on and her options.            Menses typically are regular each month- last for 7-8 days with 2 days of heavy bleeding. Usually day 2 or 3 is her heavy day where she is often soaking through a tampon with period underwear in under an hour. She has had accidents at work and often has to get up in the middle of the night due to heavy bleeding. Considerable dysmenorrhea- take ibuprofen regularly during her period.            Of note, recently, she has started to have intermenstrual bleeding- menses then 3-4 days off then spotting for a few weeks. Additinally she notes uterine soreness- most noticeable during ovulation and sometimes with increased activity.            She has a h/o tubal ligation.            Pt. denies experiencing vaginal discharge that itches, burns, or has an odor.       Isolation Precautions          Has patient received COVID-19 vaccination?  Yes - [employer].  Does patient report new onset of COVID symptoms?  No.  Has patient or close contact tested positive for COVID-19?  No , not in the past 2 weeks.       Current Medication:      Taking   Vitamin D 50 MCG (2000 UT) Tablet 1 tablet Orally Once a day.      Fish Oil 500 MG Capsule 1 capsule Orally Twice a day, Notes: unknown dosage.      MVI 1 tab Oral.      Medication List reviewed and reconciled with the patient.      Medical History:   Medical History Verified.       Allergies/Intolerance:      N.K.D.A.    Gyn History:   Sexual activity  currently sexually active.   Periods : every month.   LMP 05-30-20.   Birth control none, BTL.   Last pap smear date 02/2019.   Last mammogram date 02/2020.   Abnormal pap smear abnormal not sure with what.   Denies STD.        OB History:   Number of pregnancies  5.   Pregnancy # 1  2008, girl, vaginal delivery.   Pregnancy # 2  miscarriage, 2010.   Pregnancy # 3  boy, vaginal delivery, 2012.   Pregnancy # 4:  2014, miscarriage twins.   Pregnancy # 5:  2015, , Girl, , vaginal delivery.        Surgical History:   Appendix 1989      partial thyroidectomy 2005      breast augmentation 2016      tubes tied  2015      D&C 2014       Hospitalization:   surgeries       Family History:   Father: alive, diagnosed with Diabetes, Prostate CA    Mother: alive, skin cancer, diagnosed with Hypertension    Paternal Grand Father: alive, diagnosed with Lung Cancer    Paternal Grand Mother: alive, diagnosed with Breast cancer    Maternal Grand Father: alive    Maternal Grand Mother: alive    Sister 1: alive    Sister 2: alive     Social History:       General         Tobacco use cigarettes:  Never smoked, Tobacco history last updated  06/21/2020.           Alcohol: yes, wine.           Caffeine: yes, coffee, 3 serving daily.           no Recreational drug use.           Marital Status: married, husbands name is Katheline Brendlinger.           OCCUPATION: data analyst.      ROS:       CONSTITUTIONAL         Chills  No.  Fatigue  No.  Fever  No.  Night sweats  No.  Recent travel outside [place]  No.  Sweats  No.  Weight change  No.         OPHTHALMOLOGY         Blurring of vision  no.  Change in vision  no.  Double vision  no.         ENT         Dizziness  no.  Nose bleeds  no.  Sore throat  no.  Teeth pain  no.         ALLERGY         Hives  no.         CARDIOLOGY         Chest pain  no.  High blood pressure  no.  Irregular heart beat  no.  Leg edema  no.  Palpitations  no.          RESPIRATORY         Shortness of breath  no.  Cough  no.  Wheezing  no.         UROLOGY         Pain with urination  no.  Urinary urgency  no.  Urinary frequency  no.  Urinary incontinence  no.  Difficulty urinating  No.  Blood in urine  No.         GASTROENTEROLOGY         Abdominal pain  no.  Appetite change  no.  Bloating/belching  no.  Blood in stool or on toilet paper  no.  Change in bowel movements  no.  Constipation  no.  Diarrhea  no.  Difficulty swallowing  no.  Nausea  no.         FEMALE REPRODUCTIVE         Vulvar pain  no.  Vulvar rash  no.  Abnormal vaginal bleeding  no.  Breast pain  no.  Nipple discharge  no.  Pain with intercourse  no.  Pelvic pain  no.  Unusual vaginal discharge  no.  Vaginal itching  no.         MUSCULOSKELETAL  Muscle aches  no.         NEUROLOGY         Headache  no.  Tingling/numbness  no.  Weakness  no.         PSYCHOLOGY         Depression  no.  Anxiety  no.  Nervousness  no.  Sleep disturbances  no.  Suicidal ideation  no .         ENDOCRINOLOGY         Excessive thirst  no.  Excessive urination  no.  Hair loss  no.  Heat or cold intolerance  no.         HEMATOLOGY/LYMPH         Abnormal bleeding  no.  Easy bruising  no.  Swollen glands  no.         DERMATOLOGY         New/changing skin lesion  no.  Rash  no.  Sores  no.            Negative except as stated in HPI.   Objective:    Vitals:        Wt 142.4, Wt change 1.2 lb, Ht 64, BMI 24.44, Pulse sitting 66.     Past Results:    Examination:          General Examination         CONSTITUTIONAL: alert, oriented, NAD.          SKIN:  moist, warm.          EYES:  Conjunctiva clear.          LUNGS: clear to auscultation bilaterally , good air entry bilaterally.          HEART: heart sounds are normal, rhythm is regular, no murmur.          ABDOMEN: soft, non-tender/non-distended, bowel sounds present.          FEMALE GENITOURINARY: normal external genitalia, labia -  unremarkable, vagina - pink moist mucosa, no lesions or abnormal discharge, cervix - no discharge or lesions or CMT, adnexa - no masses or tenderness, uterus - nontender and normal size on palpation.          PSYCH:  affect normal, good eye contact.      Physical Examination:    Assessment:     Assessment:    Menorrhagia with irregular cycle - N92.1 (Primary)        Plan:    Treatment:      Menorrhagia with irregular cycle          Notes: She is scheduled for hysteroscopy D&C with hydrothermal ablation on 06/30/2020 at 12:30 pm for management. Discussed with pt. there is a risk of infection, bleeding, or damage to the uterus. Pt. advised no driving for 24 hours following surgery. She is advised to avoid sexual intercourse for 2 weeks following surgery. She is advised she might experience a watery discharge which is normal. Menses diary will be provided to pt. to review 3 months following surgery. She is advised she can take Tylenol for pain management if needed. Pt. advised to avoid food and water night prior to surgery. She is advised to avoid aspirin, ibuprofen, and aleve between now and when she has surgery. If needed she can take Tylenol before surgery. Plan to see 2 weeks after surgery for post op visit.           

## 2020-06-29 ENCOUNTER — Encounter (HOSPITAL_BASED_OUTPATIENT_CLINIC_OR_DEPARTMENT_OTHER)
Admission: RE | Admit: 2020-06-29 | Discharge: 2020-06-29 | Disposition: A | Discharge status: Home / Self Care | Payer: Managed Care, Other (non HMO) | Source: Ambulatory Visit | Attending: Obstetrics and Gynecology | Admitting: Obstetrics and Gynecology

## 2020-06-29 DIAGNOSIS — Z01812 Encounter for preprocedural laboratory examination: Secondary | ICD-10-CM | POA: Diagnosis not present

## 2020-06-29 LAB — POCT PREGNANCY, URINE: Preg Test, Ur: NEGATIVE

## 2020-06-30 ENCOUNTER — Encounter (HOSPITAL_BASED_OUTPATIENT_CLINIC_OR_DEPARTMENT_OTHER)
Admission: RE | Disposition: A | Discharge status: Home / Self Care | Payer: Self-pay | Source: Home / Self Care | Attending: Obstetrics and Gynecology

## 2020-06-30 ENCOUNTER — Ambulatory Visit (HOSPITAL_BASED_OUTPATIENT_CLINIC_OR_DEPARTMENT_OTHER)
Admission: RE | Admit: 2020-06-30 | Discharge: 2020-06-30 | Disposition: A | Discharge status: Home / Self Care | Payer: Managed Care, Other (non HMO) | Attending: Obstetrics and Gynecology | Admitting: Obstetrics and Gynecology

## 2020-06-30 ENCOUNTER — Encounter (HOSPITAL_COMMUNITY): Payer: Self-pay | Admitting: Certified Registered"

## 2020-06-30 ENCOUNTER — Encounter (HOSPITAL_BASED_OUTPATIENT_CLINIC_OR_DEPARTMENT_OTHER): Payer: Self-pay | Admitting: Obstetrics and Gynecology

## 2020-06-30 ENCOUNTER — Other Ambulatory Visit: Payer: Self-pay

## 2020-06-30 DIAGNOSIS — N920 Excessive and frequent menstruation with regular cycle: Secondary | ICD-10-CM | POA: Insufficient documentation

## 2020-06-30 LAB — POCT PREGNANCY, URINE: Preg Test, Ur: NEGATIVE

## 2020-06-30 SURGERY — DILATATION & CURETTAGE/HYSTEROSCOPY WITH HYDROTHERMAL ABLATION
Anesthesia: Choice

## 2020-06-30 MED ORDER — LACTATED RINGERS IV SOLN: INTRAVENOUS | Status: DC

## 2020-06-30 MED ORDER — ACETAMINOPHEN 500 MG PO TABS: 1000.0000 mg | ORAL_TABLET | ORAL | Status: DC

## 2020-06-30 MED ORDER — ACETAMINOPHEN 500 MG PO TABS
ORAL_TABLET | ORAL | Status: AC
  Filled 2020-06-30: qty 2

## 2020-06-30 MED ORDER — POVIDONE-IODINE 10 % EX SWAB: 2.0000 "application " | Freq: Once | CUTANEOUS | Status: DC

## 2020-06-30 SURGICAL SUPPLY — 15 items
CATH ROBINSON RED A/P 16FR (CATHETERS) ×3 IMPLANT
ELECT REM PT RETURN 9FT ADLT (ELECTROSURGICAL)
ELECTRODE REM PT RTRN 9FT ADLT (ELECTROSURGICAL) IMPLANT
GLOVE BIOGEL M 6.5 STRL (GLOVE) ×6 IMPLANT
GLOVE BIOGEL PI IND STRL 6.5 (GLOVE) ×1 IMPLANT
GLOVE BIOGEL PI IND STRL 7.0 (GLOVE) ×1 IMPLANT
GLOVE BIOGEL PI INDICATOR 6.5 (GLOVE) ×2
GLOVE BIOGEL PI INDICATOR 7.0 (GLOVE) ×2
GOWN STRL REUS W/ TWL LRG LVL3 (GOWN DISPOSABLE) ×2 IMPLANT
GOWN STRL REUS W/TWL LRG LVL3 (GOWN DISPOSABLE) ×6
PACK VAGINAL MINOR WOMEN LF (CUSTOM PROCEDURE TRAY) IMPLANT
PAD OB MATERNITY 4.3X12.25 (PERSONAL CARE ITEMS) ×3 IMPLANT
SET GENESYS HTA PROCERVA (MISCELLANEOUS) IMPLANT
TOWEL GREEN STERILE FF (TOWEL DISPOSABLE) ×6 IMPLANT
UNDERPAD 30X36 HEAVY ABSORB (UNDERPADS AND DIAPERS) ×3 IMPLANT

## 2020-06-30 NOTE — Progress Notes (Signed)
Pt states that she did not quarantine for surgery. 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 Dr. Richardson Dopp made the decision to reschedule the procedure. Pt is upset about the situation and asked if she could reschedule for tomorrow. I tried calling Dr. Dawayne Patricia office but they are closed from 1-2pm. I instructed pt to call Cole's office right at 2pm and asked to speak with the surgery scheduler to see if rescheduling for tomorrow is an option. If the office says they can do it tomorrow I instructed the pt to go to the covid testing center today before they close so she can have the procedure tomorrow.

## 2020-07-06 ENCOUNTER — Encounter (HOSPITAL_BASED_OUTPATIENT_CLINIC_OR_DEPARTMENT_OTHER): Payer: Self-pay | Admitting: *Deleted

## 2020-07-06 ENCOUNTER — Other Ambulatory Visit: Payer: Self-pay

## 2020-07-09 ENCOUNTER — Other Ambulatory Visit (HOSPITAL_COMMUNITY): Payer: BLUE CROSS/BLUE SHIELD

## 2020-07-10 ENCOUNTER — Other Ambulatory Visit (HOSPITAL_COMMUNITY)
Admission: RE | Admit: 2020-07-10 | Discharge: 2020-07-10 | Disposition: A | Discharge status: Home / Self Care | Payer: Managed Care, Other (non HMO) | Source: Ambulatory Visit | Attending: Obstetrics and Gynecology | Admitting: Obstetrics and Gynecology

## 2020-07-10 DIAGNOSIS — Z01812 Encounter for preprocedural laboratory examination: Secondary | ICD-10-CM | POA: Diagnosis present

## 2020-07-10 DIAGNOSIS — Z20822 Contact with and (suspected) exposure to covid-19: Secondary | ICD-10-CM | POA: Diagnosis not present

## 2020-07-10 LAB — SARS CORONAVIRUS 2 (TAT 6-24 HRS): SARS Coronavirus 2: NEGATIVE

## 2020-07-13 ENCOUNTER — Encounter (HOSPITAL_BASED_OUTPATIENT_CLINIC_OR_DEPARTMENT_OTHER)
Admission: RE | Disposition: A | Discharge status: Home / Self Care | Payer: Self-pay | Source: Home / Self Care | Attending: Obstetrics and Gynecology

## 2020-07-13 ENCOUNTER — Ambulatory Visit (HOSPITAL_BASED_OUTPATIENT_CLINIC_OR_DEPARTMENT_OTHER)
Admission: RE | Admit: 2020-07-13 | Discharge: 2020-07-13 | Disposition: A | Discharge status: Home / Self Care | Payer: Managed Care, Other (non HMO) | Attending: Obstetrics and Gynecology | Admitting: Obstetrics and Gynecology

## 2020-07-13 ENCOUNTER — Other Ambulatory Visit: Payer: Self-pay | Admitting: Obstetrics and Gynecology

## 2020-07-13 ENCOUNTER — Ambulatory Visit (HOSPITAL_BASED_OUTPATIENT_CLINIC_OR_DEPARTMENT_OTHER): Payer: Managed Care, Other (non HMO) | Admitting: Anesthesiology

## 2020-07-13 ENCOUNTER — Encounter (HOSPITAL_BASED_OUTPATIENT_CLINIC_OR_DEPARTMENT_OTHER): Payer: Self-pay | Admitting: Obstetrics and Gynecology

## 2020-07-13 ENCOUNTER — Other Ambulatory Visit: Payer: Self-pay

## 2020-07-13 DIAGNOSIS — Z79899 Other long term (current) drug therapy: Secondary | ICD-10-CM | POA: Insufficient documentation

## 2020-07-13 DIAGNOSIS — Z833 Family history of diabetes mellitus: Secondary | ICD-10-CM | POA: Diagnosis not present

## 2020-07-13 DIAGNOSIS — Z8042 Family history of malignant neoplasm of prostate: Secondary | ICD-10-CM | POA: Insufficient documentation

## 2020-07-13 DIAGNOSIS — E890 Postprocedural hypothyroidism: Secondary | ICD-10-CM | POA: Diagnosis not present

## 2020-07-13 DIAGNOSIS — N920 Excessive and frequent menstruation with regular cycle: Secondary | ICD-10-CM | POA: Diagnosis not present

## 2020-07-13 DIAGNOSIS — N946 Dysmenorrhea, unspecified: Secondary | ICD-10-CM | POA: Insufficient documentation

## 2020-07-13 DIAGNOSIS — Z803 Family history of malignant neoplasm of breast: Secondary | ICD-10-CM | POA: Diagnosis not present

## 2020-07-13 DIAGNOSIS — Z8249 Family history of ischemic heart disease and other diseases of the circulatory system: Secondary | ICD-10-CM | POA: Insufficient documentation

## 2020-07-13 DIAGNOSIS — Z801 Family history of malignant neoplasm of trachea, bronchus and lung: Secondary | ICD-10-CM | POA: Diagnosis not present

## 2020-07-13 DIAGNOSIS — N939 Abnormal uterine and vaginal bleeding, unspecified: Secondary | ICD-10-CM | POA: Insufficient documentation

## 2020-07-13 DIAGNOSIS — N840 Polyp of corpus uteri: Secondary | ICD-10-CM | POA: Insufficient documentation

## 2020-07-13 HISTORY — PX: DILITATION & CURRETTAGE/HYSTROSCOPY WITH HYDROTHERMAL ABLATION: SHX5570

## 2020-07-13 LAB — TYPE AND SCREEN
ABO/RH(D): AB POS
Antibody Screen: NEGATIVE

## 2020-07-13 LAB — POCT PREGNANCY, URINE: Preg Test, Ur: NEGATIVE

## 2020-07-13 SURGERY — DILATATION & CURETTAGE/HYSTEROSCOPY WITH HYDROTHERMAL ABLATION
Anesthesia: General

## 2020-07-13 MED ORDER — MEPERIDINE HCL 25 MG/ML IJ SOLN: 6.2500 mg | INTRAMUSCULAR | Status: DC | PRN

## 2020-07-13 MED ORDER — HYDROMORPHONE HCL 1 MG/ML IJ SOLN: 0.2500 mg | INTRAMUSCULAR | Status: DC | PRN

## 2020-07-13 MED ORDER — LACTATED RINGERS IV SOLN: INTRAVENOUS | Status: DC

## 2020-07-13 MED ORDER — PROMETHAZINE HCL 25 MG/ML IJ SOLN: 6.2500 mg | INTRAMUSCULAR | Status: DC | PRN

## 2020-07-13 MED ORDER — GLYCOPYRROLATE PF 0.2 MG/ML IJ SOSY
PREFILLED_SYRINGE | INTRAMUSCULAR | Status: AC
  Filled 2020-07-13: qty 1

## 2020-07-13 MED ORDER — OXYCODONE HCL 5 MG PO TABS: 5.0000 mg | ORAL_TABLET | Freq: Once | ORAL | Status: DC | PRN

## 2020-07-13 MED ORDER — ACETAMINOPHEN 500 MG PO TABS: 1000.0000 mg | ORAL_TABLET | Freq: Once | ORAL | Status: DC

## 2020-07-13 MED ORDER — POVIDONE-IODINE 10 % EX SWAB
2.0000 "application " | Freq: Once | CUTANEOUS | Status: AC
  Administered 2020-07-13: 2 via TOPICAL

## 2020-07-13 MED ORDER — SCOPOLAMINE 1 MG/3DAYS TD PT72
1.0000 | MEDICATED_PATCH | TRANSDERMAL | Status: DC
  Administered 2020-07-13: 1.5 mg via TRANSDERMAL

## 2020-07-13 MED ORDER — BUPIVACAINE HCL (PF) 0.25 % IJ SOLN
INTRAMUSCULAR | Status: DC | PRN
  Administered 2020-07-13: 20 mL

## 2020-07-13 MED ORDER — SILVER NITRATE-POT NITRATE 75-25 % EX MISC
CUTANEOUS | Status: AC
  Filled 2020-07-13: qty 10

## 2020-07-13 MED ORDER — DEXAMETHASONE SODIUM PHOSPHATE 10 MG/ML IJ SOLN
INTRAMUSCULAR | Status: AC
  Filled 2020-07-13: qty 1

## 2020-07-13 MED ORDER — CELECOXIB 200 MG PO CAPS
ORAL_CAPSULE | ORAL | Status: AC
  Filled 2020-07-13: qty 2

## 2020-07-13 MED ORDER — ACETAMINOPHEN 500 MG PO TABS
1000.0000 mg | ORAL_TABLET | ORAL | Status: AC
  Administered 2020-07-13: 1000 mg via ORAL

## 2020-07-13 MED ORDER — BUPIVACAINE HCL (PF) 0.5 % IJ SOLN
INTRAMUSCULAR | Status: AC
  Filled 2020-07-13: qty 30

## 2020-07-13 MED ORDER — LIDOCAINE 2% (20 MG/ML) 5 ML SYRINGE
INTRAMUSCULAR | Status: AC
  Filled 2020-07-13: qty 5

## 2020-07-13 MED ORDER — ONDANSETRON HCL 4 MG/2ML IJ SOLN
INTRAMUSCULAR | Status: DC | PRN
  Administered 2020-07-13: 4 mg via INTRAVENOUS

## 2020-07-13 MED ORDER — SCOPOLAMINE 1 MG/3DAYS TD PT72
MEDICATED_PATCH | TRANSDERMAL | Status: AC
  Filled 2020-07-13: qty 1

## 2020-07-13 MED ORDER — EPHEDRINE 5 MG/ML INJ
INTRAVENOUS | Status: AC
  Filled 2020-07-13: qty 10

## 2020-07-13 MED ORDER — KETOROLAC TROMETHAMINE 30 MG/ML IJ SOLN: 30.0000 mg | Freq: Once | INTRAMUSCULAR | Status: DC | PRN

## 2020-07-13 MED ORDER — PROPOFOL 500 MG/50ML IV EMUL
INTRAVENOUS | Status: DC | PRN
  Administered 2020-07-13: 25 ug/kg/min via INTRAVENOUS

## 2020-07-13 MED ORDER — OXYTOCIN 10 UNIT/ML IJ SOLN
INTRAMUSCULAR | Status: AC
  Filled 2020-07-13: qty 1

## 2020-07-13 MED ORDER — IBUPROFEN 800 MG PO TABS: 800.0000 mg | ORAL_TABLET | Freq: Three times a day (TID) | ORAL | 0 refills | Status: AC | PRN

## 2020-07-13 MED ORDER — MISOPROSTOL 200 MCG PO TABS
ORAL_TABLET | ORAL | Status: AC
  Filled 2020-07-13: qty 5

## 2020-07-13 MED ORDER — FENTANYL CITRATE (PF) 100 MCG/2ML IJ SOLN
INTRAMUSCULAR | Status: AC
  Filled 2020-07-13: qty 2

## 2020-07-13 MED ORDER — SILVER NITRATE-POT NITRATE 75-25 % EX MISC
CUTANEOUS | Status: DC | PRN
  Administered 2020-07-13: 4 via TOPICAL

## 2020-07-13 MED ORDER — ONDANSETRON HCL 4 MG/2ML IJ SOLN
INTRAMUSCULAR | Status: AC
  Filled 2020-07-13: qty 2

## 2020-07-13 MED ORDER — DEXAMETHASONE SODIUM PHOSPHATE 10 MG/ML IJ SOLN
INTRAMUSCULAR | Status: DC | PRN
  Administered 2020-07-13: 10 mg via INTRAVENOUS

## 2020-07-13 MED ORDER — FENTANYL CITRATE (PF) 100 MCG/2ML IJ SOLN
INTRAMUSCULAR | Status: DC | PRN
  Administered 2020-07-13: 100 ug via INTRAVENOUS

## 2020-07-13 MED ORDER — PROPOFOL 10 MG/ML IV BOLUS
INTRAVENOUS | Status: AC
  Filled 2020-07-13: qty 20

## 2020-07-13 MED ORDER — OXYCODONE HCL 5 MG/5ML PO SOLN: 5.0000 mg | Freq: Once | ORAL | Status: DC | PRN

## 2020-07-13 MED ORDER — MIDAZOLAM HCL 2 MG/2ML IJ SOLN
INTRAMUSCULAR | Status: AC
  Filled 2020-07-13: qty 2

## 2020-07-13 MED ORDER — MIDAZOLAM HCL 5 MG/5ML IJ SOLN
INTRAMUSCULAR | Status: DC | PRN
  Administered 2020-07-13: 2 mg via INTRAVENOUS

## 2020-07-13 MED ORDER — LIDOCAINE HCL 1 % IJ SOLN
INTRAMUSCULAR | Status: DC | PRN
  Administered 2020-07-13: 60 mg via INTRADERMAL

## 2020-07-13 MED ORDER — PROPOFOL 10 MG/ML IV BOLUS
INTRAVENOUS | Status: DC | PRN
  Administered 2020-07-13: 200 mg via INTRAVENOUS

## 2020-07-13 MED ORDER — EPHEDRINE SULFATE 50 MG/ML IJ SOLN
INTRAMUSCULAR | Status: DC | PRN
  Administered 2020-07-13: 5 mg via INTRAVENOUS

## 2020-07-13 MED ORDER — BUPIVACAINE HCL (PF) 0.25 % IJ SOLN
INTRAMUSCULAR | Status: AC
  Filled 2020-07-13: qty 30

## 2020-07-13 MED ORDER — ACETAMINOPHEN 500 MG PO TABS
ORAL_TABLET | ORAL | Status: AC
  Filled 2020-07-13: qty 2

## 2020-07-13 MED ORDER — CELECOXIB 200 MG PO CAPS
400.0000 mg | ORAL_CAPSULE | ORAL | Status: AC
  Administered 2020-07-13: 400 mg via ORAL

## 2020-07-13 MED ORDER — AMISULPRIDE (ANTIEMETIC) 5 MG/2ML IV SOLN: 10.0000 mg | Freq: Once | INTRAVENOUS | Status: DC | PRN

## 2020-07-13 MED ORDER — GLYCOPYRROLATE 0.2 MG/ML IJ SOLN
INTRAMUSCULAR | Status: DC | PRN
  Administered 2020-07-13: .2 mg via INTRAVENOUS

## 2020-07-13 SURGICAL SUPPLY — 17 items
DECANTER SPIKE VIAL GLASS SM (MISCELLANEOUS) ×2 IMPLANT
GAUZE 4X4 16PLY RFD (DISPOSABLE) ×2 IMPLANT
GLOVE BIO SURGEON STRL SZ 6.5 (GLOVE) ×1 IMPLANT
GLOVE BIO SURGEONS STRL SZ 6.5 (GLOVE) ×1
GLOVE BIOGEL M 6.5 STRL (GLOVE) ×6 IMPLANT
GLOVE BIOGEL PI IND STRL 6.5 (GLOVE) ×1 IMPLANT
GLOVE BIOGEL PI IND STRL 7.0 (GLOVE) ×1 IMPLANT
GLOVE BIOGEL PI INDICATOR 6.5 (GLOVE) ×2
GLOVE BIOGEL PI INDICATOR 7.0 (GLOVE) ×2
GOWN STRL REUS W/ TWL LRG LVL3 (GOWN DISPOSABLE) ×2 IMPLANT
GOWN STRL REUS W/TWL LRG LVL3 (GOWN DISPOSABLE) ×6
PACK VAGINAL MINOR WOMEN LF (CUSTOM PROCEDURE TRAY) ×3 IMPLANT
PAD OB MATERNITY 4.3X12.25 (PERSONAL CARE ITEMS) ×3 IMPLANT
SET GENESYS HTA PROCERVA (MISCELLANEOUS) ×2 IMPLANT
SLEEVE SCD COMPRESS KNEE MED (MISCELLANEOUS) ×2 IMPLANT
TOWEL GREEN STERILE FF (TOWEL DISPOSABLE) ×6 IMPLANT
UNDERPAD 30X36 HEAVY ABSORB (UNDERPADS AND DIAPERS) ×3 IMPLANT

## 2020-07-13 NOTE — Anesthesia Postprocedure Evaluation (Signed)
Anesthesia Post Note  Patient: Diane [name]  Procedure(s) Performed: DILATATION & CURETTAGE/HYSTEROSCOPY WITH HYDROTHERMAL ABLATION (N/A )     Patient location during evaluation: PACU Anesthesia Type: General Level of consciousness: awake and alert, oriented and patient cooperative Pain management: pain level controlled Vital Signs Assessment: post-procedure vital signs reviewed and stable Respiratory status: spontaneous breathing, nonlabored ventilation and respiratory function stable Cardiovascular status: blood pressure returned to baseline and stable Postop Assessment: no apparent nausea or vomiting Anesthetic complications: no   No complications documented.  Last Vitals:  Vitals:   07/13/20 1430 07/13/20 1436  BP: 113/61 112/64  Pulse: 61 66  Resp: 17 14  Temp:    SpO2: 100% 100%    Last Pain:  Vitals:   07/13/20 1430  TempSrc:   PainSc: 0-No pain                 Lannie Fields

## 2020-07-13 NOTE — Transfer of Care (Signed)
Immediate Anesthesia Transfer of Care Note  Patient: Diane [name]  Procedure(s) Performed: DILATATION & CURETTAGE/HYSTEROSCOPY WITH HYDROTHERMAL ABLATION (N/A )  Patient Location: PACU  Anesthesia Type:General  Level of Consciousness: drowsy, patient cooperative and responds to stimulation  Airway & Oxygen Therapy: Patient Spontanous Breathing and Patient connected to face mask oxygen  Post-op Assessment: Report given to RN and Post -op Vital signs reviewed and stable  Post vital signs: Reviewed and stable  Last Vitals:  Vitals Value Taken Time  BP    Temp    Pulse 90 07/13/20 1401  Resp 17 07/13/20 1401  SpO2 100 % 07/13/20 1401  Vitals shown include unvalidated device data.  Last Pain:  Vitals:   07/13/20 1126  TempSrc: Oral      Patients Stated Pain Goal: 3 (07/13/20 1126)  Complications: No complications documented.

## 2020-07-13 NOTE — Anesthesia Procedure Notes (Signed)
Procedure Name: LMA Insertion Date/Time: 07/13/2020 1:02 PM Performed by: Thornell Mule, CRNA Pre-anesthesia Checklist: Patient identified, Emergency Drugs available, Suction available and Patient being monitored Patient Re-evaluated:Patient Re-evaluated prior to induction Oxygen Delivery Method: Circle system utilized Preoxygenation: Pre-oxygenation with 100% oxygen Induction Type: IV induction LMA: LMA inserted LMA Size: 4.0 Number of attempts: 1 Placement Confirmation: positive ETCO2 Tube secured with: Tape Dental Injury: Teeth and Oropharynx as per pre-operative assessment

## 2020-07-13 NOTE — H&P (Deleted)
  The note originally documented on this encounter has been moved the the encounter in which it belongs.  

## 2020-07-13 NOTE — Discharge Instructions (Signed)
  Post Anesthesia Home Care Instructions  Activity: Get plenty of rest for the remainder of the day. A responsible individual must stay with you for 24 hours following the procedure.  For the next 24 hours, DO NOT: -Drive a car -Advertising copywriter -Drink alcoholic beverages -Take any medication unless instructed by your physician -Make any legal decisions or sign important papers.  Meals: Start with liquid foods such as gelatin or soup. Progress to regular foods as tolerated. Avoid greasy, spicy, heavy foods. If nausea and/or vomiting occur, drink only clear liquids until the nausea and/or vomiting subsides. Call your physician if vomiting continues.  Special Instructions/Symptoms: Your throat may feel dry or sore from the anesthesia or the breathing tube placed in your throat during surgery. If this causes discomfort, gargle with warm salt water. The discomfort should disappear within 24 hours.  If you had a scopolamine patch placed behind your ear for the management of post- operative nausea and/or vomiting:  1. The medication in the patch is effective for 72 hours, after which it should be removed.  Wrap patch in a tissue and discard in the trash. Wash hands thoroughly with soap and water. 2. You may remove the patch earlier than 72 hours if you experience unpleasant side effects which may include dry mouth, dizziness or visual disturbances. 3. Avoid touching the patch. Wash your hands with soap and water after contact with the patch.    May take Tylenol and Ibuprofen after 6pm, if needed.

## 2020-07-13 NOTE — Op Note (Signed)
07/13/2020  2:03 PM  PATIENT:  Diane [name]  44 y.o. female  PRE-OPERATIVE DIAGNOSIS:  N93.9 abnormal uterine bleeding  POST-OPERATIVE DIAGNOSIS:  N93.9 abnormal uterine bleeding  PROCEDURE:  Procedure(s): DILATATION & CURETTAGE/HYSTEROSCOPY WITH HYDROTHERMAL ABLATION (N/A)  SURGEON:  Surgeon(s) and Role:    Gerald Leitz, MD - Primary  PHYSICIAN ASSISTANT:   ASSISTANTS: none   ANESTHESIA:   general  EBL:  10 mL   BLOOD ADMINISTERED:none  DRAINS: none   LOCAL MEDICATIONS USED:  MARCAINE     SPECIMEN:  Source of Specimen:  endometrial curettings  DISPOSITION OF SPECIMEN:  PATHOLOGY  COUNTS:  YES  TOURNIQUET:  * No tourniquets in log *  DICTATION: .Note written in paper chart  PLAN OF CARE: Admit to inpatient   PATIENT DISPOSITION:  PACU - hemodynamically stable.   Delay start of Pharmacological VTE agent (>24hrs) due to surgical blood loss or risk of bleeding: not applicable   Findings. Normal external genitalia normal cervix.. proliferative endometrium . Small endometrial polyp noted.   Procedure. The patient was taken to the operative room were a time out was performed. She was placed in the dorsal lithotomy position and prepped and draped in the normal sterile fashion. A speculum was placed in the vaginal vault. The anterior lip of the cervix was grasped with a single tooth tenaculum. The uterus was sounded to 8 cm. The cervix was dilated to 8 mm. The hydrothermal hysteroscope was inserted. The ostia were visualized bilaterally. There was no evidence of perforation. The hydrothermal ablation was performed for 10 minutes.   The hysteroscope was removed. Sharp curette was introduced and curettage was performed.  10 cc of 25% marcaine was injected at the 4 and 8 oclock position.  The single tooth tenaculum was removed. Pt was noted to have bleeding from the tenaculum site. Silver nitrate was applied.   Excellent hemostasis was noted.   Sponge lap and needle counts  were correct x 2.  The patient was awakened from anesthesia and taken to the recovery room in stable condition.

## 2020-07-13 NOTE — H&P (Signed)
Subjective:    Chief Complaint(s):      preop/abnormal uterine bleeding.       HPI:          General          44 y/o presents for pre op visit            She is scheduled for hysteroscopy D&C with hydrothermal ablation on 06/30/2020 at 12:30 pm for management of AUB.             Ongoing issue for over a year- previously seen at [facility]- work up including labs and [place] that showed no abnormalities. Advised pill, but she wishes to understand more about what's going on and her options.            Menses typically are regular each month- last for 7-8 days with 2 days of heavy bleeding. Usually day 2 or 3 is her heavy day where she is often soaking through a tampon with period underwear in under an hour. She has had accidents at work and often has to get up in the middle of the night due to heavy bleeding. Considerable dysmenorrhea- take ibuprofen regularly during her period.            Of note, recently, she has started to have intermenstrual bleeding- menses then 3-4 days off then spotting for a few weeks. Additinally she notes uterine soreness- most noticeable during ovulation and sometimes with increased activity.            She has a h/o tubal ligation.            Pt. denies experiencing vaginal discharge that itches, burns, or has an odor.       Isolation Precautions          Has patient received COVID-19 vaccination?  Yes - [employer].  Does patient report new onset of COVID symptoms?  No.  Has patient or close contact tested positive for COVID-19?  No , not in the past 2 weeks.       Current Medication:      Taking   Vitamin D 50 MCG (2000 UT) Tablet 1 tablet Orally Once a day.      Fish Oil 500 MG Capsule 1 capsule Orally Twice a day, Notes: unknown dosage.      MVI 1 tab Oral.      Medication List reviewed and reconciled with the patient.      Medical History:   Medical History Verified.       Allergies/Intolerance:      N.K.D.A.    Gyn History:   Sexual activity  currently sexually active.   Periods : every month.   LMP 05-30-20.   Birth control none, BTL.   Last pap smear date 02/2019.   Last mammogram date 02/2020.   Abnormal pap smear abnormal not sure with what.   Denies STD.        OB History:   Number of pregnancies  5.   Pregnancy # 1  2008, girl, vaginal delivery.   Pregnancy # 2  miscarriage, 2010.   Pregnancy # 3  boy, vaginal delivery, 2012.   Pregnancy # 4:  2014, miscarriage twins.   Pregnancy # 5:  2015, , Girl, , vaginal delivery.        Surgical History:   Appendix 1989      partial thyroidectomy 2005      breast augmentation 2016      tubes tied  2015      D&C 2014       Hospitalization:   surgeries       Family History:   Father: alive, diagnosed with Diabetes, Prostate CA    Mother: alive, skin cancer, diagnosed with Hypertension    Paternal Grand Father: alive, diagnosed with Lung Cancer    Paternal Grand Mother: alive, diagnosed with Breast cancer    Maternal Grand Father: alive    Maternal Grand Mother: alive    Sister 1: alive    Sister 2: alive     Social History:       General         Tobacco use cigarettes:  Never smoked, Tobacco history last updated  06/21/2020.           Alcohol: yes, wine.           Caffeine: yes, coffee, 3 serving daily.           no Recreational drug use.           Marital Status: married, husbands name is Katheline Brendlinger.           OCCUPATION: data analyst.      ROS:       CONSTITUTIONAL         Chills  No.  Fatigue  No.  Fever  No.  Night sweats  No.  Recent travel outside [place]  No.  Sweats  No.  Weight change  No.         OPHTHALMOLOGY         Blurring of vision  no.  Change in vision  no.  Double vision  no.         ENT         Dizziness  no.  Nose bleeds  no.  Sore throat  no.  Teeth pain  no.         ALLERGY         Hives  no.         CARDIOLOGY         Chest pain  no.  High blood pressure  no.  Irregular heart beat  no.  Leg edema  no.  Palpitations  no.          RESPIRATORY         Shortness of breath  no.  Cough  no.  Wheezing  no.         UROLOGY         Pain with urination  no.  Urinary urgency  no.  Urinary frequency  no.  Urinary incontinence  no.  Difficulty urinating  No.  Blood in urine  No.         GASTROENTEROLOGY         Abdominal pain  no.  Appetite change  no.  Bloating/belching  no.  Blood in stool or on toilet paper  no.  Change in bowel movements  no.  Constipation  no.  Diarrhea  no.  Difficulty swallowing  no.  Nausea  no.         FEMALE REPRODUCTIVE         Vulvar pain  no.  Vulvar rash  no.  Abnormal vaginal bleeding  no.  Breast pain  no.  Nipple discharge  no.  Pain with intercourse  no.  Pelvic pain  no.  Unusual vaginal discharge  no.  Vaginal itching  no.         MUSCULOSKELETAL  Muscle aches  no.         NEUROLOGY         Headache  no.  Tingling/numbness  no.  Weakness  no.         PSYCHOLOGY         Depression  no.  Anxiety  no.  Nervousness  no.  Sleep disturbances  no.  Suicidal ideation  no .         ENDOCRINOLOGY         Excessive thirst  no.  Excessive urination  no.  Hair loss  no.  Heat or cold intolerance  no.         HEMATOLOGY/LYMPH         Abnormal bleeding  no.  Easy bruising  no.  Swollen glands  no.         DERMATOLOGY         New/changing skin lesion  no.  Rash  no.  Sores  no.            Negative except as stated in HPI.   Objective:    Vitals:        Wt 142.4, Wt change 1.2 lb, Ht 64, BMI 24.44, Pulse sitting 66.     Past Results:    Examination:          General Examination         CONSTITUTIONAL: alert, oriented, NAD.          SKIN:  moist, warm.          EYES:  Conjunctiva clear.          LUNGS: clear to auscultation bilaterally , good air entry bilaterally.          HEART: heart sounds are normal, rhythm is regular, no murmur.          ABDOMEN: soft, non-tender/non-distended, bowel sounds present.          FEMALE GENITOURINARY: normal external genitalia, labia -  unremarkable, vagina - pink moist mucosa, no lesions or abnormal discharge, cervix - no discharge or lesions or CMT, adnexa - no masses or tenderness, uterus - nontender and normal size on palpation.          PSYCH:  affect normal, good eye contact.      Physical Examination:    Assessment:     Assessment:    Menorrhagia with irregular cycle - N92.1 (Primary)        Plan:    Treatment:      Menorrhagia with irregular cycle          Notes: She is scheduled for hysteroscopy D&C with hydrothermal ablation on 06/30/2020 at 12:30 pm for management. Discussed with pt. there is a risk of infection, bleeding, or damage to the uterus. Pt. advised no driving for 24 hours following surgery. She is advised to avoid sexual intercourse for 2 weeks following surgery. She is advised she might experience a watery discharge which is normal. Menses diary will be provided to pt. to review 3 months following surgery. She is advised she can take Tylenol for pain management if needed. Pt. advised to avoid food and water night prior to surgery. She is advised to avoid aspirin, ibuprofen, and aleve between now and when she has surgery. If needed she can take Tylenol before surgery. Plan to see 2 weeks after surgery for post op visit.

## 2020-07-13 NOTE — H&P (Signed)
Date of Initial H&P:07/13/2020  History reviewed, patient examined, no change in status, stable for surgery. 

## 2020-07-13 NOTE — Anesthesia Preprocedure Evaluation (Addendum)
Anesthesia Evaluation  Patient identified by MRN, date of birth, ID band Patient awake    Reviewed: Allergy & Precautions, NPO status , Patient's Chart, lab work & pertinent test results  History of Anesthesia Complications (+) PONV and history of anesthetic complications  Airway Mallampati: I  TM Distance: >3 FB Neck ROM: Full    Dental no notable dental hx. (+) Teeth Intact, Dental Advisory Given   Pulmonary neg pulmonary ROS,    Pulmonary exam normal breath sounds clear to auscultation       Cardiovascular negative cardio ROS Normal cardiovascular exam Rhythm:Regular Rate:Normal     Neuro/Psych negative neurological ROS  negative psych ROS   GI/Hepatic negative GI ROS, (+)     substance abuse  alcohol use,   Endo/Other  negative endocrine ROS  Renal/GU negative Renal ROS  Female GU complaint Abnormal uterine bleeding     Musculoskeletal negative musculoskeletal ROS (+)   Abdominal   Peds  Hematology negative hematology ROS (+)   Anesthesia Other Findings   Reproductive/Obstetrics negative OB ROS                            Anesthesia Physical Anesthesia Plan  ASA: I  Anesthesia Plan: General   Post-op Pain Management:    Induction: Intravenous  PONV Risk Score and Plan: 4 or greater and Ondansetron, Dexamethasone, Midazolam, Scopolamine patch - Pre-op, Propofol infusion and Treatment may vary due to age or medical condition  Airway Management Planned: LMA  Additional Equipment: None  Intra-op Plan:   Post-operative Plan: Extubation in OR  Informed Consent: I have reviewed the patients History and Physical, chart, labs and discussed the procedure including the risks, benefits and alternatives for the proposed anesthesia with the patient or authorized representative who has indicated his/her understanding and acceptance.     Dental advisory given  Plan Discussed  with: CRNA  Anesthesia Plan Comments:        Anesthesia Quick Evaluation

## 2020-07-14 ENCOUNTER — Encounter (HOSPITAL_BASED_OUTPATIENT_CLINIC_OR_DEPARTMENT_OTHER): Payer: Self-pay | Admitting: Obstetrics and Gynecology

## 2020-07-15 LAB — SURGICAL PATHOLOGY

## 2022-01-06 DIAGNOSIS — N926 Irregular menstruation, unspecified: Secondary | ICD-10-CM | POA: Diagnosis not present

## 2022-01-06 DIAGNOSIS — Z124 Encounter for screening for malignant neoplasm of cervix: Secondary | ICD-10-CM | POA: Diagnosis not present

## 2022-01-06 DIAGNOSIS — N939 Abnormal uterine and vaginal bleeding, unspecified: Secondary | ICD-10-CM | POA: Diagnosis not present

## 2022-01-06 DIAGNOSIS — Z01411 Encounter for gynecological examination (general) (routine) with abnormal findings: Secondary | ICD-10-CM | POA: Diagnosis not present

## 2022-01-09 DIAGNOSIS — N926 Irregular menstruation, unspecified: Secondary | ICD-10-CM | POA: Diagnosis not present

## 2022-02-15 DIAGNOSIS — D2271 Melanocytic nevi of right lower limb, including hip: Secondary | ICD-10-CM | POA: Diagnosis not present

## 2022-02-15 DIAGNOSIS — D22121 Melanocytic nevi of left upper eyelid, including canthus: Secondary | ICD-10-CM | POA: Diagnosis not present

## 2022-02-15 DIAGNOSIS — D2261 Melanocytic nevi of right upper limb, including shoulder: Secondary | ICD-10-CM | POA: Diagnosis not present

## 2022-02-15 DIAGNOSIS — D224 Melanocytic nevi of scalp and neck: Secondary | ICD-10-CM | POA: Diagnosis not present

## 2022-03-16 DIAGNOSIS — Z3202 Encounter for pregnancy test, result negative: Secondary | ICD-10-CM | POA: Diagnosis not present

## 2022-03-16 DIAGNOSIS — N9412 Deep dyspareunia: Secondary | ICD-10-CM | POA: Diagnosis not present

## 2022-03-16 DIAGNOSIS — N923 Ovulation bleeding: Secondary | ICD-10-CM | POA: Diagnosis not present

## 2022-03-16 DIAGNOSIS — N83202 Unspecified ovarian cyst, left side: Secondary | ICD-10-CM | POA: Diagnosis not present

## 2022-03-16 DIAGNOSIS — N852 Hypertrophy of uterus: Secondary | ICD-10-CM | POA: Diagnosis not present

## 2022-07-13 DIAGNOSIS — N83202 Unspecified ovarian cyst, left side: Secondary | ICD-10-CM | POA: Diagnosis not present

## 2022-07-13 DIAGNOSIS — N923 Ovulation bleeding: Secondary | ICD-10-CM | POA: Diagnosis not present

## 2023-01-31 DIAGNOSIS — Z01419 Encounter for gynecological examination (general) (routine) without abnormal findings: Secondary | ICD-10-CM | POA: Diagnosis not present

## 2023-04-21 DIAGNOSIS — H66002 Acute suppurative otitis media without spontaneous rupture of ear drum, left ear: Secondary | ICD-10-CM | POA: Diagnosis not present

## 2023-04-25 DIAGNOSIS — H66002 Acute suppurative otitis media without spontaneous rupture of ear drum, left ear: Secondary | ICD-10-CM | POA: Diagnosis not present

## 2023-04-25 DIAGNOSIS — H6992 Unspecified Eustachian tube disorder, left ear: Secondary | ICD-10-CM | POA: Diagnosis not present

## 2023-05-09 DIAGNOSIS — D2271 Melanocytic nevi of right lower limb, including hip: Secondary | ICD-10-CM | POA: Diagnosis not present

## 2023-05-09 DIAGNOSIS — D224 Melanocytic nevi of scalp and neck: Secondary | ICD-10-CM | POA: Diagnosis not present

## 2023-05-09 DIAGNOSIS — D2262 Melanocytic nevi of left upper limb, including shoulder: Secondary | ICD-10-CM | POA: Diagnosis not present

## 2023-05-09 DIAGNOSIS — D2261 Melanocytic nevi of right upper limb, including shoulder: Secondary | ICD-10-CM | POA: Diagnosis not present

## 2024-02-11 DIAGNOSIS — Z01419 Encounter for gynecological examination (general) (routine) without abnormal findings: Secondary | ICD-10-CM | POA: Diagnosis not present

## 2024-05-27 DIAGNOSIS — Z0000 Encounter for general adult medical examination without abnormal findings: Secondary | ICD-10-CM | POA: Diagnosis not present

## 2024-05-27 DIAGNOSIS — R5383 Other fatigue: Secondary | ICD-10-CM | POA: Diagnosis not present

## 2024-05-27 DIAGNOSIS — Z833 Family history of diabetes mellitus: Secondary | ICD-10-CM | POA: Diagnosis not present

## 2024-05-27 DIAGNOSIS — Z8639 Personal history of other endocrine, nutritional and metabolic disease: Secondary | ICD-10-CM | POA: Diagnosis not present

## 2024-06-02 DIAGNOSIS — R748 Abnormal levels of other serum enzymes: Secondary | ICD-10-CM | POA: Diagnosis not present

## 2024-06-02 DIAGNOSIS — Z0000 Encounter for general adult medical examination without abnormal findings: Secondary | ICD-10-CM | POA: Diagnosis not present

## 2024-06-02 DIAGNOSIS — G250 Essential tremor: Secondary | ICD-10-CM | POA: Diagnosis not present

## 2024-06-02 DIAGNOSIS — E890 Postprocedural hypothyroidism: Secondary | ICD-10-CM | POA: Diagnosis not present

## 2024-06-25 DIAGNOSIS — Z1212 Encounter for screening for malignant neoplasm of rectum: Secondary | ICD-10-CM | POA: Diagnosis not present

## 2024-06-25 DIAGNOSIS — Z1211 Encounter for screening for malignant neoplasm of colon: Secondary | ICD-10-CM | POA: Diagnosis not present

## 2024-08-11 DIAGNOSIS — L720 Epidermal cyst: Secondary | ICD-10-CM | POA: Diagnosis not present

## 2024-08-11 DIAGNOSIS — D2261 Melanocytic nevi of right upper limb, including shoulder: Secondary | ICD-10-CM | POA: Diagnosis not present

## 2024-08-11 DIAGNOSIS — D225 Melanocytic nevi of trunk: Secondary | ICD-10-CM | POA: Diagnosis not present

## 2024-08-11 DIAGNOSIS — D2262 Melanocytic nevi of left upper limb, including shoulder: Secondary | ICD-10-CM | POA: Diagnosis not present
